# Patient Record
Sex: Male | Born: 1985 | Race: White | Hispanic: No | Marital: Married | Smoking: Never smoker
Health system: Southern US, Community
[De-identification: ages and names within clinical notes are randomized; demographics above are authoritative.]

## PROBLEM LIST (undated history)

## (undated) DIAGNOSIS — K5792 Diverticulitis of intestine, part unspecified, without perforation or abscess without bleeding: Secondary | ICD-10-CM

## (undated) DIAGNOSIS — K219 Gastro-esophageal reflux disease without esophagitis: Secondary | ICD-10-CM

## (undated) DIAGNOSIS — F32A Depression, unspecified: Secondary | ICD-10-CM

## (undated) DIAGNOSIS — F419 Anxiety disorder, unspecified: Secondary | ICD-10-CM

## (undated) DIAGNOSIS — G4730 Sleep apnea, unspecified: Secondary | ICD-10-CM

## (undated) DIAGNOSIS — S069X9A Unspecified intracranial injury with loss of consciousness of unspecified duration, initial encounter: Secondary | ICD-10-CM

## (undated) DIAGNOSIS — S069XAA Unspecified intracranial injury with loss of consciousness status unknown, initial encounter: Secondary | ICD-10-CM

## (undated) DIAGNOSIS — N289 Disorder of kidney and ureter, unspecified: Secondary | ICD-10-CM

## (undated) DIAGNOSIS — N189 Chronic kidney disease, unspecified: Secondary | ICD-10-CM

## (undated) DIAGNOSIS — M6282 Rhabdomyolysis: Secondary | ICD-10-CM

## (undated) HISTORY — PX: COLONOSCOPY: SHX174

---

## 2009-09-14 ENCOUNTER — Encounter: Admission: RE | Admit: 2009-09-14 | Discharge: 2009-09-14 | Payer: Self-pay | Admitting: Occupational Medicine

## 2009-09-18 DIAGNOSIS — N179 Acute kidney failure, unspecified: Secondary | ICD-10-CM

## 2009-09-18 DIAGNOSIS — M6282 Rhabdomyolysis: Secondary | ICD-10-CM

## 2009-09-18 HISTORY — DX: Rhabdomyolysis: M62.82

## 2009-09-18 HISTORY — DX: Acute kidney failure, unspecified: N17.9

## 2010-02-06 ENCOUNTER — Emergency Department: Payer: Self-pay | Admitting: Emergency Medicine

## 2010-03-15 ENCOUNTER — Emergency Department (HOSPITAL_COMMUNITY): Admission: EM | Admit: 2010-03-15 | Discharge: 2010-03-15 | Payer: Self-pay | Admitting: Emergency Medicine

## 2010-03-17 ENCOUNTER — Ambulatory Visit: Payer: Self-pay | Admitting: Cardiovascular Disease

## 2010-03-17 ENCOUNTER — Inpatient Hospital Stay: Payer: Self-pay | Admitting: Internal Medicine

## 2010-04-25 ENCOUNTER — Emergency Department: Payer: Self-pay | Admitting: Emergency Medicine

## 2010-06-09 ENCOUNTER — Ambulatory Visit: Payer: Self-pay | Admitting: Internal Medicine

## 2010-10-04 ENCOUNTER — Emergency Department (HOSPITAL_COMMUNITY)
Admission: EM | Admit: 2010-10-04 | Discharge: 2010-10-04 | Payer: Self-pay | Source: Home / Self Care | Admitting: Emergency Medicine

## 2011-01-02 IMAGING — CR DG LUMBAR SPINE COMPLETE 4+V
5 series · 5 of 5 positions shown · non-contrast
Comparison: None

CLINICAL DATA: Low back pain following injury.

LUMBAR SPINE - COMPLETE 4+ VIEW

[t l-spine a.p.]
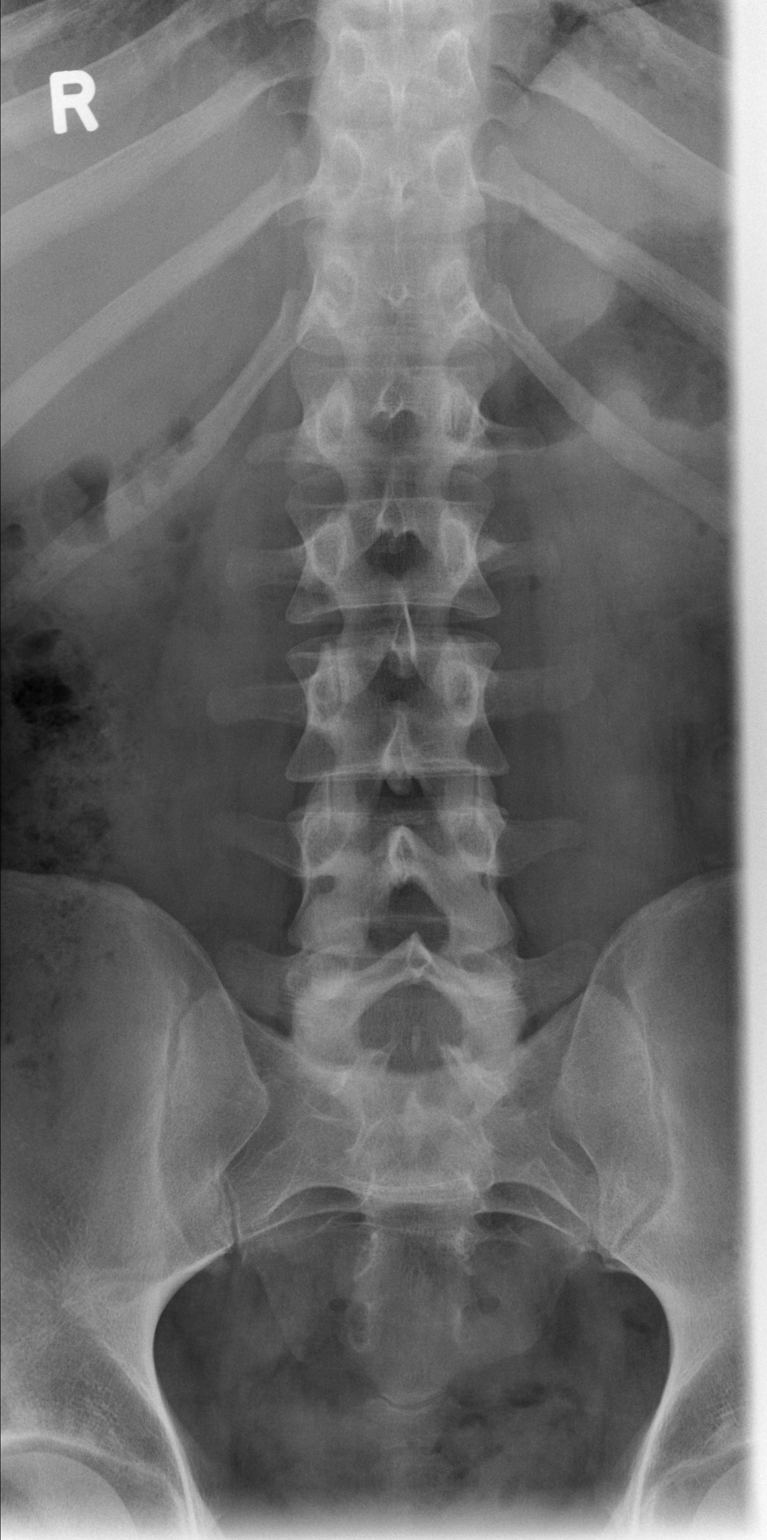

[t l-spine oblique exposure (1 of 2)]
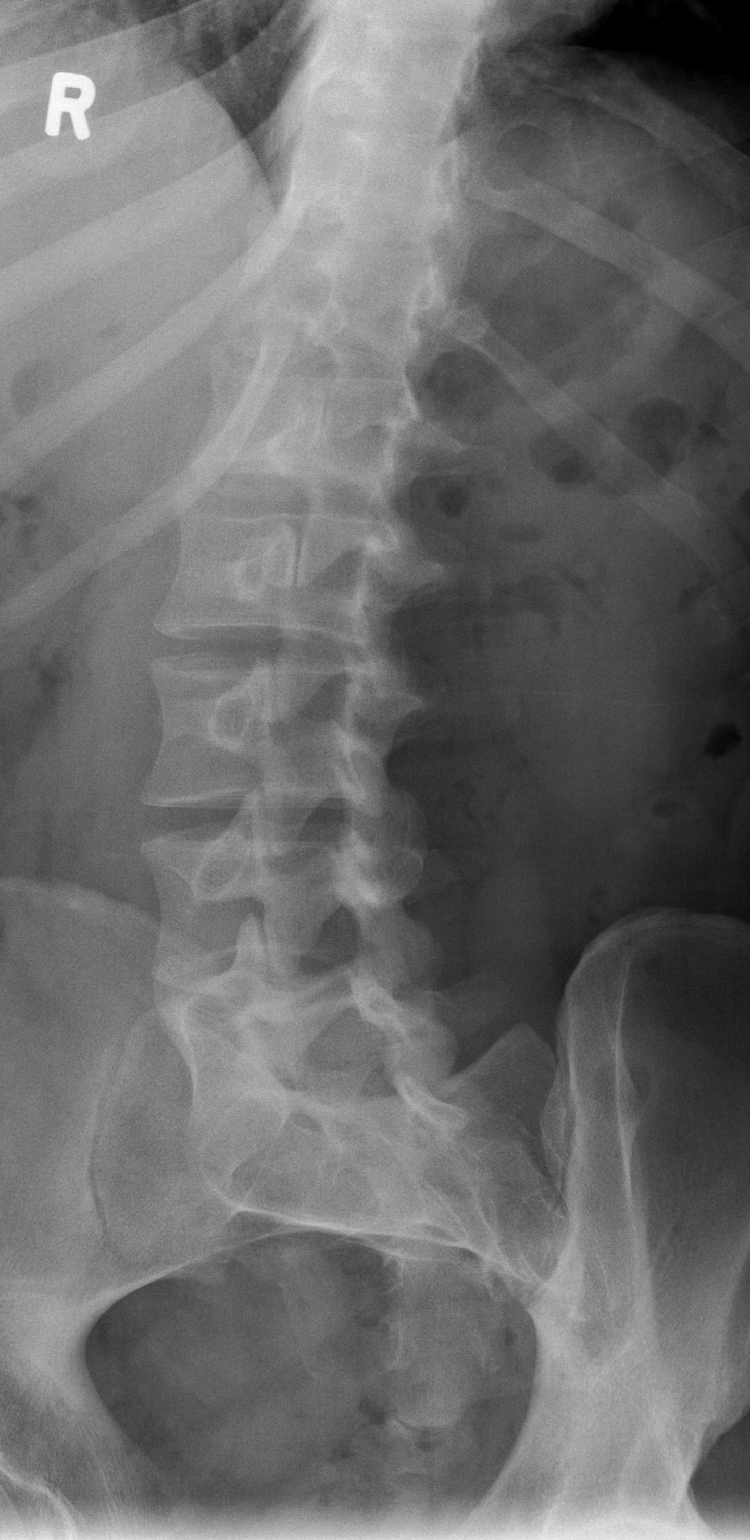

[t l-spine oblique exposure (2 of 2)]
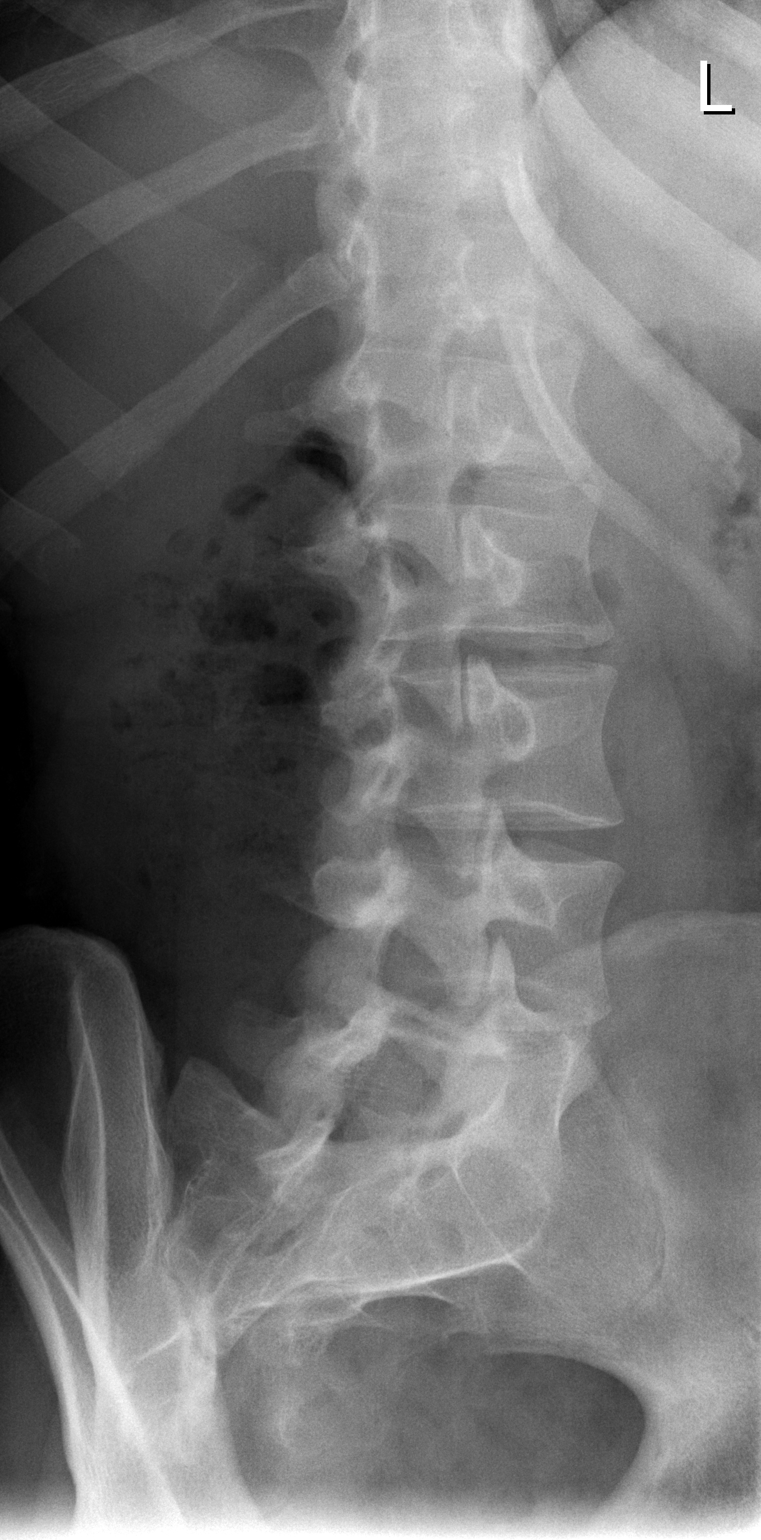

[t l-spine lat]
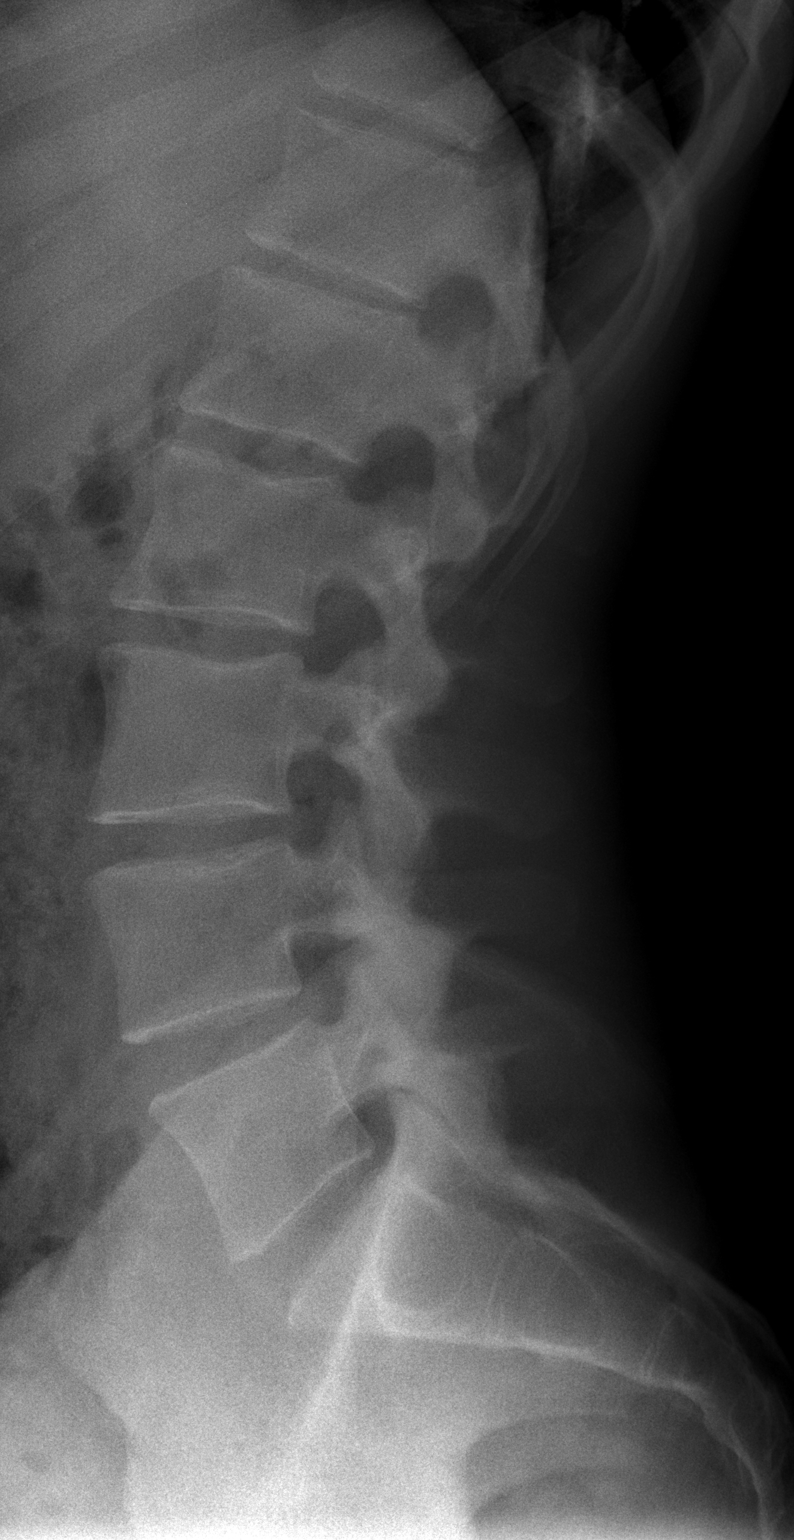

[t l-spine l5-s1 spot]
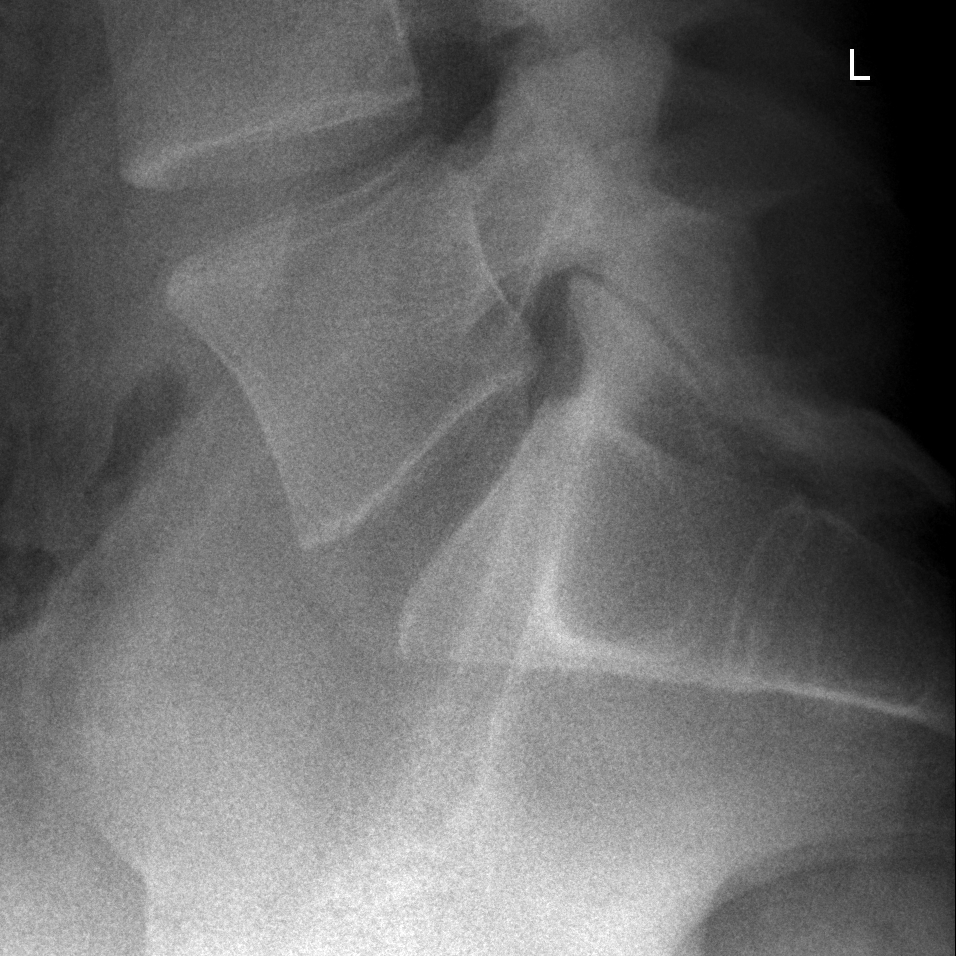

[5 of 5 positions shown; findings below may reference images not displayed]

FINDINGS: Five non-rib bearing lumbar type vertebra are identified
in normal alignment.
There is no evidence of acute fracture or subluxation.
The disc spaces are maintained.
There is no evidence of focal bony lesion or spondylolysis.
IMPRESSION: Unremarkable lumbar spine series.

## 2011-10-11 ENCOUNTER — Emergency Department: Payer: Self-pay | Admitting: Emergency Medicine

## 2012-04-25 ENCOUNTER — Emergency Department (HOSPITAL_COMMUNITY)
Admission: EM | Admit: 2012-04-25 | Discharge: 2012-04-26 | Disposition: A | Discharge status: Home / Self Care | Payer: Worker's Compensation | Attending: Emergency Medicine | Admitting: Emergency Medicine

## 2012-04-25 DIAGNOSIS — W268XXA Contact with other sharp object(s), not elsewhere classified, initial encounter: Secondary | ICD-10-CM | POA: Insufficient documentation

## 2012-04-25 DIAGNOSIS — Y990 Civilian activity done for income or pay: Secondary | ICD-10-CM | POA: Insufficient documentation

## 2012-04-25 DIAGNOSIS — Z7721 Contact with and (suspected) exposure to potentially hazardous body fluids: Secondary | ICD-10-CM | POA: Insufficient documentation

## 2012-04-25 DIAGNOSIS — S51809A Unspecified open wound of unspecified forearm, initial encounter: Secondary | ICD-10-CM | POA: Insufficient documentation

## 2012-04-25 DIAGNOSIS — IMO0002 Reserved for concepts with insufficient information to code with codable children: Secondary | ICD-10-CM | POA: Insufficient documentation

## 2012-04-25 DIAGNOSIS — S61209A Unspecified open wound of unspecified finger without damage to nail, initial encounter: Secondary | ICD-10-CM | POA: Insufficient documentation

## 2012-04-25 DIAGNOSIS — T148XXA Other injury of unspecified body region, initial encounter: Secondary | ICD-10-CM

## 2012-04-25 MED ORDER — TETANUS-DIPHTH-ACELL PERTUSSIS 5-2.5-18.5 LF-MCG/0.5 IM SUSP
0.5000 mL | Freq: Once | INTRAMUSCULAR | Status: AC
  Administered 2012-04-26: 0.5 mL via INTRAMUSCULAR
  Filled 2012-04-25: qty 0.5

## 2012-04-25 NOTE — ED Notes (Signed)
Bed:WTR7<BR> Expected date:<BR> Expected time:<BR> Means of arrival:<BR> Comments:<BR>

## 2012-04-26 ENCOUNTER — Encounter (HOSPITAL_COMMUNITY): Payer: Self-pay | Admitting: Emergency Medicine
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
## 2012-04-30 ENCOUNTER — Ambulatory Visit: Payer: Worker's Compensation

## 2012-04-30 ENCOUNTER — Other Ambulatory Visit: Payer: Self-pay | Admitting: Occupational Medicine

## 2012-04-30 DIAGNOSIS — M79646 Pain in unspecified finger(s): Secondary | ICD-10-CM

## 2014-03-06 ENCOUNTER — Emergency Department (HOSPITAL_COMMUNITY)
Admission: EM | Admit: 2014-03-06 | Discharge: 2014-03-06 | Disposition: A | Discharge status: Home / Self Care | Payer: Worker's Compensation | Attending: Emergency Medicine | Admitting: Emergency Medicine

## 2014-03-06 ENCOUNTER — Encounter (HOSPITAL_COMMUNITY): Payer: Self-pay | Admitting: Emergency Medicine

## 2014-03-06 DIAGNOSIS — Z87820 Personal history of traumatic brain injury: Secondary | ICD-10-CM | POA: Insufficient documentation

## 2014-03-06 DIAGNOSIS — N289 Disorder of kidney and ureter, unspecified: Secondary | ICD-10-CM | POA: Insufficient documentation

## 2014-03-06 DIAGNOSIS — T148XXA Other injury of unspecified body region, initial encounter: Secondary | ICD-10-CM

## 2014-03-06 DIAGNOSIS — Z888 Allergy status to other drugs, medicaments and biological substances status: Secondary | ICD-10-CM | POA: Insufficient documentation

## 2014-03-06 DIAGNOSIS — Z23 Encounter for immunization: Secondary | ICD-10-CM | POA: Insufficient documentation

## 2014-03-06 DIAGNOSIS — IMO0002 Reserved for concepts with insufficient information to code with codable children: Secondary | ICD-10-CM | POA: Insufficient documentation

## 2014-03-06 HISTORY — DX: Unspecified intracranial injury with loss of consciousness status unknown, initial encounter: S06.9XAA

## 2014-03-06 HISTORY — DX: Disorder of kidney and ureter, unspecified: N28.9

## 2014-03-06 HISTORY — DX: Unspecified intracranial injury with loss of consciousness of unspecified duration, initial encounter: S06.9X9A

## 2014-03-06 MED ORDER — TETANUS-DIPHTH-ACELL PERTUSSIS 5-2.5-18.5 LF-MCG/0.5 IM SUSP
0.5000 mL | Freq: Once | INTRAMUSCULAR | Status: AC
  Administered 2014-03-06: 0.5 mL via INTRAMUSCULAR
  Filled 2014-03-06: qty 0.5
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
## 2014-03-06 NOTE — ED Provider Notes (Signed)
Medical screening examination/treatment/procedure(s) were performed by non-physician practitioner and as supervising physician I was immediately available for consultation/collaboration.   EKG Interpretation None       Stephen Kohut, MD 03/06/14 0801 
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
## 2014-10-15 ENCOUNTER — Emergency Department (HOSPITAL_COMMUNITY): Payer: Worker's Compensation

## 2014-10-15 ENCOUNTER — Encounter (HOSPITAL_COMMUNITY): Payer: Self-pay | Admitting: *Deleted

## 2014-10-15 ENCOUNTER — Emergency Department (HOSPITAL_COMMUNITY)
Admission: EM | Admit: 2014-10-15 | Discharge: 2014-10-15 | Disposition: A | Discharge status: Home / Self Care | Payer: Worker's Compensation | Attending: Emergency Medicine | Admitting: Emergency Medicine

## 2014-10-15 DIAGNOSIS — Y92410 Unspecified street and highway as the place of occurrence of the external cause: Secondary | ICD-10-CM | POA: Insufficient documentation

## 2014-10-15 DIAGNOSIS — Z87820 Personal history of traumatic brain injury: Secondary | ICD-10-CM | POA: Diagnosis not present

## 2014-10-15 DIAGNOSIS — Y9389 Activity, other specified: Secondary | ICD-10-CM | POA: Insufficient documentation

## 2014-10-15 DIAGNOSIS — Y998 Other external cause status: Secondary | ICD-10-CM | POA: Insufficient documentation

## 2014-10-15 DIAGNOSIS — T07 Unspecified multiple injuries: Secondary | ICD-10-CM | POA: Diagnosis not present

## 2014-10-15 DIAGNOSIS — Z87448 Personal history of other diseases of urinary system: Secondary | ICD-10-CM | POA: Diagnosis not present

## 2014-10-15 DIAGNOSIS — S40029A Contusion of unspecified upper arm, initial encounter: Secondary | ICD-10-CM

## 2014-10-15 LAB — I-STAT CHEM 8, ED
BUN: 22 mg/dL (ref 6–23)
CALCIUM ION: 1.23 mmol/L (ref 1.12–1.23)
Chloride: 105 mmol/L (ref 96–112)
Creatinine, Ser: 1 mg/dL (ref 0.50–1.35)
Glucose, Bld: 100 mg/dL — ABNORMAL HIGH (ref 70–99)
HCT: 48 % (ref 39.0–52.0)
Hemoglobin: 16.3 g/dL (ref 13.0–17.0)
Potassium: 4.2 mmol/L (ref 3.5–5.1)
Sodium: 140 mmol/L (ref 135–145)
TCO2: 23 mmol/L (ref 0–100)

## 2014-10-15 MED ORDER — HYDROCODONE-ACETAMINOPHEN 5-325 MG PO TABS
1.0000 | ORAL_TABLET | Freq: Once | ORAL | Status: AC
  Administered 2014-10-15: 1 via ORAL
  Filled 2014-10-15: qty 1

## 2014-10-15 MED ORDER — HYDROCODONE-ACETAMINOPHEN 5-325 MG PO TABS: 1.0000 | ORAL_TABLET | ORAL | Status: DC | PRN

## 2014-10-15 MED ORDER — IBUPROFEN 800 MG PO TABS: 800.0000 mg | ORAL_TABLET | Freq: Three times a day (TID) | ORAL | Status: DC
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
## 2014-10-15 NOTE — Discharge Instructions (Signed)
Cryotherapy °Cryotherapy means treatment with cold. Ice or gel packs can be used to reduce both pain and swelling. Ice is the most helpful within the first 24 to 48 hours after an injury or flare-up from overusing a muscle or joint. Sprains, strains, spasms, burning pain, shooting pain, and aches can all be eased with ice. Ice can also be used when recovering from surgery. Ice is effective, has very few side effects, and is safe for most people to use. °PRECAUTIONS  °Ice is not a safe treatment option for people with: °· Raynaud phenomenon. This is a condition affecting small blood vessels in the extremities. Exposure to cold may cause your problems to return. °· Cold hypersensitivity. There are many forms of cold hypersensitivity, including: °· Cold urticaria. Red, itchy hives appear on the skin when the tissues begin to warm after being iced. °· Cold erythema. This is a red, itchy rash caused by exposure to cold. °· Cold hemoglobinuria. Red blood cells break down when the tissues begin to warm after being iced. The hemoglobin that carry oxygen are passed into the urine because they cannot combine with blood proteins fast enough. °· Numbness or altered sensitivity in the area being iced. °If you have any of the following conditions, do not use ice until you have discussed cryotherapy with your caregiver: °· Heart conditions, such as arrhythmia, angina, or chronic heart disease. °· High blood pressure. °· Healing wounds or open skin in the area being iced. °· Current infections. °· Rheumatoid arthritis. °· Poor circulation. °· Diabetes. °Ice slows the blood flow in the region it is applied. This is beneficial when trying to stop inflamed tissues from spreading irritating chemicals to surrounding tissues. However, if you expose your skin to cold temperatures for too long or without the proper protection, you can damage your skin or nerves. Watch for signs of skin damage due to cold. °HOME CARE INSTRUCTIONS °Follow  these tips to use ice and cold packs safely. °· Place a dry or damp towel between the ice and skin. A damp towel will cool the skin more quickly, so you may need to shorten the time that the ice is used. °· For a more rapid response, add gentle compression to the ice. °· Ice for no more than 10 to 20 minutes at a time. The bonier the area you are icing, the less time it will take to get the benefits of ice. °· Check your skin after 5 minutes to make sure there are no signs of a poor response to cold or skin damage. °· Rest 20 minutes or more between uses. °· Once your skin is numb, you can end your treatment. You can test numbness by very lightly touching your skin. The touch should be so light that you do not see the skin dimple from the pressure of your fingertip. When using ice, most people will feel these normal sensations in this order: cold, burning, aching, and numbness. °· Do not use ice on someone who cannot communicate their responses to pain, such as small children or people with dementia. °HOW TO MAKE AN ICE PACK °Ice packs are the most common way to use ice therapy. Other methods include ice massage, ice baths, and cryosprays. Muscle creams that cause a cold, tingly feeling do not offer the same benefits that ice offers and should not be used as a substitute unless recommended by your caregiver. °To make an ice pack, do one of the following: °· Place crushed ice or a   bag of frozen vegetables in a sealable plastic bag. Squeeze out the excess air. Place this bag inside another plastic bag. Slide the bag into a pillowcase or place a damp towel between your skin and the bag. °· Mix 3 parts water with 1 part rubbing alcohol. Freeze the mixture in a sealable plastic bag. When you remove the mixture from the freezer, it will be slushy. Squeeze out the excess air. Place this bag inside another plastic bag. Slide the bag into a pillowcase or place a damp towel between your skin and the bag. °SEEK MEDICAL CARE  IF: °· You develop white spots on your skin. This may give the skin a blotchy (mottled) appearance. °· Your skin turns blue or pale. °· Your skin becomes waxy or hard. °· Your swelling gets worse. °MAKE SURE YOU:  °· Understand these instructions. °· Will watch your condition. °· Will get help right away if you are not doing well or get worse. °Document Released: 05/01/2011 Document Revised: 01/19/2014 Document Reviewed: 05/01/2011 °ExitCare® Patient Information ©2015 [employer]. This information is not intended to replace advice given to you by your health care provider. Make sure you discuss any questions you have with your health care provider. ° °Contusion °A contusion is a deep bruise. Contusions are the result of an injury that caused bleeding under the skin. The contusion may turn blue, purple, or yellow. Minor injuries will give you a painless contusion, but more severe contusions may stay painful and swollen for a few weeks.  °CAUSES  °A contusion is usually caused by a blow, trauma, or direct force to an area of the body. °SYMPTOMS  °· Swelling and redness of the injured area. °· Bruising of the injured area. °· Tenderness and soreness of the injured area. °· Pain. °DIAGNOSIS  °The diagnosis can be made by taking a history and physical exam. An X-ray, CT scan, or MRI may be needed to determine if there were any associated injuries, such as fractures. °TREATMENT  °Specific treatment will depend on what area of the body was injured. In general, the best treatment for a contusion is resting, icing, elevating, and applying cold compresses to the injured area. Over-the-counter medicines may also be recommended for pain control. Ask your caregiver what the best treatment is for your contusion. °HOME CARE INSTRUCTIONS  °· Put ice on the injured area. °¨ Put ice in a plastic bag. °¨ Place a towel between your skin and the bag. °¨ Leave the ice on for 15-20 minutes, 3-4 times a day, or as directed by your health  care provider. °· Only take over-the-counter or prescription medicines for pain, discomfort, or fever as directed by your caregiver. Your caregiver may recommend avoiding anti-inflammatory medicines (aspirin, ibuprofen, and naproxen) for 48 hours because these medicines may increase bruising. °· Rest the injured area. °· If possible, elevate the injured area to reduce swelling. °SEEK IMMEDIATE MEDICAL CARE IF:  °· You have increased bruising or swelling. °· You have pain that is getting worse. °· Your swelling or pain is not relieved with medicines. °MAKE SURE YOU:  °· Understand these instructions. °· Will watch your condition. °· Will get help right away if you are not doing well or get worse. °Document Released: 06/14/2005 Document Revised: 09/09/2013 Document Reviewed: 07/10/2011 °ExitCare® Patient Information ©2015 [employer]. This information is not intended to replace advice given to you by your health care provider. Make sure you discuss any questions you have with your health care provider. ° °

## 2014-10-15 NOTE — ED Notes (Signed)
[employer] at bedside 

## 2014-10-23 DIAGNOSIS — F410 Panic disorder [episodic paroxysmal anxiety] without agoraphobia: Secondary | ICD-10-CM | POA: Insufficient documentation

## 2016-07-12 ENCOUNTER — Other Ambulatory Visit
Admission: EM | Admit: 2016-07-12 | Discharge: 2016-07-12 | Disposition: A | Discharge status: Home / Self Care | Attending: Family Medicine | Admitting: Family Medicine
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
## 2016-07-26 DIAGNOSIS — G5712 Meralgia paresthetica, left lower limb: Secondary | ICD-10-CM | POA: Insufficient documentation

## 2016-10-16 ENCOUNTER — Encounter: Payer: Self-pay | Admitting: Physician Assistant

## 2016-10-16 ENCOUNTER — Ambulatory Visit: Payer: Self-pay | Admitting: Physician Assistant

## 2016-10-16 VITALS — BP 120/80 | HR 77 | Temp 98.7°F

## 2016-10-16 DIAGNOSIS — J209 Acute bronchitis, unspecified: Secondary | ICD-10-CM

## 2016-10-16 MED ORDER — AZITHROMYCIN 250 MG PO TABS: ORAL_TABLET | ORAL | 0 refills | Status: DC

## 2016-10-16 MED ORDER — METHYLPREDNISOLONE 4 MG PO TBPK: ORAL_TABLET | ORAL | 0 refills | Status: DC

## 2016-10-16 NOTE — Progress Notes (Signed)
S: C/o runny nose and congestion with dry cough for 7 days, + fever, chills early on, none now, is getting winded when he goes up stairs,  denies cp/sob, v/d; mucus was green/yellow  Using otc meds: mucinex, cough drops  O: PE: vitals wnl, nad,  perrl eomi, normocephalic, tms dull, nasal mucosa red and swollen, throat injected, neck supple no lymph, lungs c t a, cv rrr, neuro intact, cough is congested  A:  Acute flu like illness, secondary bronchitis   P: drink fluids, continue regular meds , use otc meds of choice, return if not improving in 3 days, return earlier if worsening , zpack, medrol dose pack, return if not improving or if worsening

## 2016-12-19 DIAGNOSIS — Z7185 Encounter for immunization safety counseling: Secondary | ICD-10-CM | POA: Insufficient documentation

## 2016-12-19 DIAGNOSIS — Z7189 Other specified counseling: Secondary | ICD-10-CM | POA: Insufficient documentation

## 2017-07-12 ENCOUNTER — Encounter: Payer: Self-pay | Admitting: Urology

## 2017-07-12 ENCOUNTER — Ambulatory Visit (INDEPENDENT_AMBULATORY_CARE_PROVIDER_SITE_OTHER): Payer: Managed Care, Other (non HMO) | Admitting: Urology

## 2017-07-12 VITALS — BP 117/69 | HR 73 | Ht 69.5 in | Wt 232.0 lb

## 2017-07-12 DIAGNOSIS — Z8719 Personal history of other diseases of the digestive system: Secondary | ICD-10-CM | POA: Insufficient documentation

## 2017-07-12 DIAGNOSIS — R361 Hematospermia: Secondary | ICD-10-CM

## 2017-07-12 DIAGNOSIS — F429 Obsessive-compulsive disorder, unspecified: Secondary | ICD-10-CM | POA: Insufficient documentation

## 2017-07-12 DIAGNOSIS — F4310 Post-traumatic stress disorder, unspecified: Secondary | ICD-10-CM | POA: Insufficient documentation

## 2017-07-12 LAB — URINALYSIS, COMPLETE
BILIRUBIN UA: NEGATIVE
Glucose, UA: NEGATIVE
KETONES UA: NEGATIVE
Leukocytes, UA: NEGATIVE
Nitrite, UA: NEGATIVE
SPEC GRAV UA: 1.01 (ref 1.005–1.030)
Urobilinogen, Ur: 0.2 mg/dL (ref 0.2–1.0)
pH, UA: 5.5 (ref 5.0–7.5)

## 2017-07-12 NOTE — Progress Notes (Signed)
07/12/2017 12:11 PM   Tollie Pizzaimothy J Mangrum [birth year] [id number]  Referring provider: Marisue IvanLinthavong, Kanhka, MD [phone] [facility] [facility] [place], [place] [id number]  Chief Complaint  Patient presents with  . Hematospermia    New Patient    HPI: William Curtis is a 31 year old male who presents today for an evaluation of hematospermia.  He states he has noted intermittent blood in his semen since age 31 however 3 weeks ago states the blood has been consistent.  He has intercourse on average 3 times per week and states that his semen is mostly blood.  He has a dull scrotal discomfort after ejaculation and mild aching with ejaculation.  He will passing occasional small clot on the first void after ejaculation.  Denies gross hematuria per se.  He has no bothersome lower urinary tract symptoms and voids with a good stream.  He has mild dysuria on his first void after ejaculation.  He has a history of acute renal failure requiring dialysis secondary to rhabdomyolysis.   PMH: Past Medical History:  Diagnosis Date  . Renal disorder   . TBI (traumatic brain injury) [facility](HCC)     Surgical History: No past surgical history on file.  Home Medications:  Allergies as of 07/12/2017      Reactions   Zolmitriptan Other (See Comments)   Chest tightness and numbness in side Chest tightness   Ciprofloxacin Rash   Oxycodone-acetaminophen Rash      Medication List       Accurate as of 07/12/17 12:11 PM. Always use your most recent med list.          busPIRone 10 MG tablet Commonly known as:  BUSPAR Take by mouth.   PARoxetine 20 MG tablet Commonly known as:  PAXIL Take by mouth.       Allergies:  Allergies  Allergen Reactions  . Zolmitriptan Other (See Comments)    Chest tightness and numbness in side Chest tightness  . Ciprofloxacin Rash  . Oxycodone-Acetaminophen Rash    Family History: No family history on file.  Social History:  reports that he has never  smoked. He has never used smokeless tobacco. He reports that he drinks alcohol. His drug history is not on file.  ROS: UROLOGY Frequent Urination?: No Hard to postpone urination?: No Burning/pain with urination?: Yes Get up at night to urinate?: No Leakage of urine?: No Urine stream starts and stops?: No Trouble starting stream?: No Do you have to strain to urinate?: No Blood in urine?: Yes Urinary tract infection?: No Sexually transmitted disease?: No Injury to kidneys or bladder?: Yes Painful intercourse?: No Weak stream?: No Erection problems?: No Penile pain?: No  Gastrointestinal Nausea?: No Vomiting?: No Indigestion/heartburn?: Yes Diarrhea?: No Constipation?: No  Constitutional Fever: No Night sweats?: Yes Weight loss?: No Fatigue?: Yes  Skin Skin rash/lesions?: No Itching?: No  Eyes Blurred vision?: No Double vision?: No  Ears/Nose/Throat Sore throat?: No Sinus problems?: No  Hematologic/Lymphatic Swollen glands?: No Easy bruising?: No  Cardiovascular Leg swelling?: No Chest pain?: No  Respiratory Cough?: No Shortness of breath?: No  Endocrine Excessive thirst?: No  Musculoskeletal Back pain?: No Joint pain?: No  Neurological Headaches?: No Dizziness?: No  Psychologic Depression?: Yes Anxiety?: Yes  Physical Exam: BP 117/69   Pulse 73   Ht 5' 9.5" (1.765 m)   Wt 232 lb (105.2 kg)   BMI 33.77 kg/m   Constitutional:  Alert and oriented, No acute distress. HEENT: [place] AT, moist mucus membranes.  Trachea  midline, no masses. Cardiovascular: No clubbing, cyanosis, or edema. Respiratory: Normal respiratory effort, no increased work of breathing. GI: Abdomen is soft, nontender, nondistended, no abdominal masses GU: No CVA tenderness.  Penis circumcised without lesions, testes descended bilaterally without masses or tenderness, cord/epididymes palpably normal.  Prostate 30 g, smooth without nodules or tenderness. Skin: No rashes,  bruises or suspicious lesions. Lymph: No cervical or inguinal adenopathy. Neurologic: Grossly intact, no focal deficits, moving all 4 extremities. Psychiatric: Normal mood and affect.  Laboratory Data: Lab Results  Component Value Date   HGB 16.3 10/15/2014   HCT 48.0 10/15/2014    Lab Results  Component Value Date   CREATININE 1.00 10/15/2014    Urinalysis Dipstick and microscopy negative   Assessment & Plan:   1. Hematospermia  Based on his age and long-standing intermittent hematospermia have recommended scheduling a pelvic MRI to evaluate for congenital seminal vesicle anomalies.  Cystoscopy if no significant abnormality identified on MRI.  Urinalysis today was clear  - Urinalysis, Complete - MR Pelvis W Wo Contrast; Future    Riki Altes, MD  [facility] [address] [place], [place] [address] [phone]

## 2017-07-24 ENCOUNTER — Ambulatory Visit: Payer: Managed Care, Other (non HMO) | Admitting: Urology

## 2017-07-25 ENCOUNTER — Telehealth: Payer: Self-pay | Admitting: Urology

## 2017-07-25 DIAGNOSIS — R361 Hematospermia: Secondary | ICD-10-CM

## 2017-07-25 NOTE — Telephone Encounter (Signed)
Spoke with the patient today and he wanted you to know that every time now it is nothing but blood. No semen just blood coming out. I have not canceled his appointment for next week yet, should he keep it?  Please advise  Thanks, Marcelino DusterMichelle

## 2017-07-26 NOTE — Telephone Encounter (Signed)
Since mri was not approved sched CT pelvis.  Order was entered.

## 2017-07-26 NOTE — Telephone Encounter (Signed)
So once again I had to fax notes to his insurance and they will review them to see if this can be approved. Will keep you posted.  William DusterMichelle

## 2017-08-02 ENCOUNTER — Ambulatory Visit: Payer: Managed Care, Other (non HMO) | Admitting: Urology

## 2017-08-22 ENCOUNTER — Telehealth: Payer: Self-pay | Admitting: Urology

## 2017-08-22 NOTE — Telephone Encounter (Signed)
They denied his CT scan  [facility]

## 2017-08-23 ENCOUNTER — Telehealth: Payer: Self-pay | Admitting: Urology

## 2017-08-23 NOTE — Telephone Encounter (Signed)
Recommend scheduling transrectal ultrasound of the prostate in office

## 2017-08-23 NOTE — Telephone Encounter (Signed)
Done ° ° °Michelle °

## 2017-09-05 ENCOUNTER — Encounter: Payer: Self-pay | Admitting: Urology

## 2017-09-05 ENCOUNTER — Ambulatory Visit: Payer: Managed Care, Other (non HMO) | Admitting: Urology

## 2017-09-05 VITALS — BP 116/84 | HR 68 | Ht 69.5 in | Wt 237.8 lb

## 2017-09-05 DIAGNOSIS — R361 Hematospermia: Secondary | ICD-10-CM | POA: Diagnosis not present

## 2017-09-05 NOTE — Progress Notes (Signed)
Procedure note  Indications: 31 year old male with hematospermia.  Pelvic imaging was recommended however denied by his insurance company.  They would cover transrectal ultrasound of the prostate.  He states his hematospermia has resolved.  Description: A transrectal ultrasound probe was lubricated and placed per rectum.  Sagittal and transverse views were obtained.  The seminal vesicles were normal in appearance without dilation.  No cystic structures were identified.  The prostate volume was measured at 24.5 cc.  There were scattered prostatic calcifications present.  Impression: Unremarkable prostate ultrasound  Recommendation: Hematospermia has resolved.  He will return in 2-4 weeks for a baseline PSA.  Cystoscopy for recurrent hematospermia.

## 2017-09-19 ENCOUNTER — Other Ambulatory Visit: Payer: Managed Care, Other (non HMO)

## 2017-09-19 DIAGNOSIS — R361 Hematospermia: Secondary | ICD-10-CM

## 2017-09-20 ENCOUNTER — Telehealth: Payer: Self-pay

## 2017-09-20 LAB — PSA: PROSTATE SPECIFIC AG, SERUM: 0.9 ng/mL (ref 0.0–4.0)

## 2017-09-20 NOTE — Telephone Encounter (Signed)
-----   Message from Riki AltesScott C Stoioff, MD sent at 09/20/2017  9:32 AM EST ----- PSA normal at 0.9

## 2017-09-20 NOTE — Telephone Encounter (Signed)
Pt.notified

## 2018-05-28 DIAGNOSIS — Z0000 Encounter for general adult medical examination without abnormal findings: Secondary | ICD-10-CM | POA: Diagnosis not present

## 2018-06-05 DIAGNOSIS — Z0000 Encounter for general adult medical examination without abnormal findings: Secondary | ICD-10-CM | POA: Diagnosis not present

## 2019-01-30 DIAGNOSIS — R0982 Postnasal drip: Secondary | ICD-10-CM | POA: Diagnosis not present

## 2019-03-19 ENCOUNTER — Telehealth: Payer: Self-pay | Admitting: Urology

## 2019-03-19 ENCOUNTER — Emergency Department
Admission: EM | Admit: 2019-03-19 | Discharge: 2019-03-19 | Disposition: A | Discharge status: Home / Self Care | Payer: 59 | Attending: Emergency Medicine | Admitting: Emergency Medicine

## 2019-03-19 ENCOUNTER — Encounter: Payer: Self-pay | Admitting: Emergency Medicine

## 2019-03-19 ENCOUNTER — Other Ambulatory Visit: Payer: Self-pay

## 2019-03-19 DIAGNOSIS — Z87820 Personal history of traumatic brain injury: Secondary | ICD-10-CM | POA: Diagnosis not present

## 2019-03-19 DIAGNOSIS — R339 Retention of urine, unspecified: Secondary | ICD-10-CM

## 2019-03-19 DIAGNOSIS — R361 Hematospermia: Secondary | ICD-10-CM | POA: Diagnosis not present

## 2019-03-19 DIAGNOSIS — Z79899 Other long term (current) drug therapy: Secondary | ICD-10-CM | POA: Insufficient documentation

## 2019-03-19 DIAGNOSIS — N50811 Right testicular pain: Secondary | ICD-10-CM | POA: Diagnosis not present

## 2019-03-19 DIAGNOSIS — Z885 Allergy status to narcotic agent status: Secondary | ICD-10-CM | POA: Diagnosis not present

## 2019-03-19 DIAGNOSIS — Z888 Allergy status to other drugs, medicaments and biological substances status: Secondary | ICD-10-CM | POA: Insufficient documentation

## 2019-03-19 DIAGNOSIS — Z881 Allergy status to other antibiotic agents status: Secondary | ICD-10-CM | POA: Diagnosis not present

## 2019-03-19 LAB — BASIC METABOLIC PANEL
Anion gap: 10 (ref 5–15)
BUN: 24 mg/dL — ABNORMAL HIGH (ref 6–20)
CO2: 27 mmol/L (ref 22–32)
Calcium: 9.6 mg/dL (ref 8.9–10.3)
Chloride: 103 mmol/L (ref 98–111)
Creatinine, Ser: 1.21 mg/dL (ref 0.61–1.24)
GFR calc Af Amer: >60 mL/min (ref >60)
GFR calc non Af Amer: >60 mL/min (ref >60)
Glucose, Bld: 101 mg/dL — ABNORMAL HIGH (ref 70–99)
Potassium: 4.3 mmol/L (ref 3.5–5.1)
Sodium: 140 mmol/L (ref 135–145)

## 2019-03-19 LAB — CBC WITH DIFFERENTIAL/PLATELET
Abs Immature Granulocytes: 0.02 10*3/uL (ref 0.00–0.07)
Basophils Absolute: 0.1 10*3/uL (ref 0.0–0.1)
Basophils Relative: 1 %
Eosinophils Absolute: 0.1 10*3/uL (ref 0.0–0.5)
Eosinophils Relative: 1 %
HCT: 45.2 % (ref 39.0–52.0)
Hemoglobin: 15.5 g/dL (ref 13.0–17.0)
Immature Granulocytes: 0 %
Lymphocytes Relative: 21 %
Lymphs Abs: 1.2 10*3/uL (ref 0.7–4.0)
MCH: 31.7 pg (ref 26.0–34.0)
MCHC: 34.3 g/dL (ref 30.0–36.0)
MCV: 92.4 fL (ref 80.0–100.0)
Monocytes Absolute: 0.7 10*3/uL (ref 0.1–1.0)
Monocytes Relative: 13 %
Neutro Abs: 3.6 10*3/uL (ref 1.7–7.7)
Neutrophils Relative %: 64 %
Platelets: 224 10*3/uL (ref 150–400)
RBC: 4.89 MIL/uL (ref 4.22–5.81)
RDW: 12.6 % (ref 11.5–15.5)
WBC: 5.7 10*3/uL (ref 4.0–10.5)
nRBC: 0 % (ref 0.0–0.2)

## 2019-03-19 LAB — URINALYSIS, COMPLETE (UACMP) WITH MICROSCOPIC
Bacteria, UA: NONE SEEN
RBC / HPF: >50 RBC/hpf — ABNORMAL HIGH (ref 0–5)
Specific Gravity, Urine: 1.033 — ABNORMAL HIGH (ref 1.005–1.030)
Squamous Epithelial / HPF: NONE SEEN (ref 0–5)

## 2019-03-19 LAB — URINE DRUG SCREEN, QUALITATIVE (ARMC ONLY)
Amphetamines, Ur Screen: NOT DETECTED
Barbiturates, Ur Screen: NOT DETECTED
Benzodiazepine, Ur Scrn: NOT DETECTED
Cannabinoid 50 Ng, Ur ~~LOC~~: NOT DETECTED
Cocaine Metabolite,Ur ~~LOC~~: NOT DETECTED
MDMA (Ecstasy)Ur Screen: NOT DETECTED
Methadone Scn, Ur: NOT DETECTED
Opiate, Ur Screen: NOT DETECTED
Phencyclidine (PCP) Ur S: NOT DETECTED
Tricyclic, Ur Screen: NOT DETECTED

## 2019-03-19 MED ORDER — CEPHALEXIN 500 MG PO CAPS
500.0000 mg | ORAL_CAPSULE | Freq: Once | ORAL | Status: AC
  Administered 2019-03-19: 500 mg via ORAL
  Filled 2019-03-19: qty 1

## 2019-03-19 MED ORDER — CEPHALEXIN 500 MG PO CAPS: 500.0000 mg | ORAL_CAPSULE | Freq: Two times a day (BID) | ORAL | 0 refills | Status: AC

## 2019-03-19 MED ORDER — TAMSULOSIN HCL 0.4 MG PO CAPS: 0.4000 mg | ORAL_CAPSULE | Freq: Every day | ORAL | 0 refills | Status: AC

## 2019-03-19 NOTE — ED Provider Notes (Signed)
[facility] Provider Note ____________________________________________   First MD Initiated Contact with Patient 03/19/19 [phone]     (approximate)  I have reviewed the triage vital signs and the nursing notes.   HISTORY  Chief Complaint Urinary Retention    HPI William Curtis is a 33 y.o. male with PMH as noted below who presents with urinary retention, acute onset yesterday afternoon and persistent since then, associated with suprapubic and right testicular pain.  The patient states that he has had hematospermia intermittently over the last few years.  He had 2 episodes this week where he had intercourse, ejaculated, and then had blood.  He states that after the second time, which was yesterday, he then was unable to urinate.  Although he has had hematospermia in the past, this has not caused retention before.  He was worked up by urology a couple of years ago for the hematospermia.  He denies fever chills, vomiting or diarrhea.  Past Medical History:  Diagnosis Date  . Renal disorder   . TBI (traumatic brain injury) [facility](HCC)     Patient Active Problem List   Diagnosis Date Noted  . PTSD (post-traumatic stress disorder) 07/12/2017  . OCD (obsessive compulsive disorder) 07/12/2017  . History of ulcerative colitis 07/12/2017  . Vaccine counseling 12/19/2016  . Meralgia paresthetica, left 07/26/2016  . Anxiety attack 10/23/2014    History reviewed. No pertinent surgical history.  Prior to Admission medications   Medication Sig Start Date End Date Taking? Authorizing Provider  busPIRone (BUSPAR) 30 MG tablet Take 30 mg by mouth 2 (two) times daily.    Yes [provider]  PARoxetine (PAXIL) 30 MG tablet Take 30 mg by mouth daily.  02/14/17  Yes [provider]  cephALEXin (KEFLEX) 500 MG capsule Take 1 capsule (500 mg total) by mouth 2 (two) times daily for 7 days. 03/19/19 03/26/19  Dionne BucySiadecki, Buford Bremer, MD    Allergies  Zolmitriptan, Ciprofloxacin, and Oxycodone-acetaminophen  No family history on file.  Social History Social History   Tobacco Use  . Smoking status: Never Smoker  . Smokeless tobacco: Never Used  Substance Use Topics  . Alcohol use: Not Currently  . Drug use: Not Currently    Review of Systems  Constitutional: No fever. Eyes: No redness. ENT: No sore throat. Cardiovascular: Denies chest pain. Respiratory: Denies shortness of breath. Gastrointestinal: No vomiting. Genitourinary: Positive for hematospermia.  Musculoskeletal: Negative for back pain. Skin: Negative for rash. Neurological: Negative for headache.   ____________________________________________   PHYSICAL EXAM:  VITAL SIGNS: ED Triage Vitals  Enc Vitals Group     BP 03/19/19 0901 (!) 138/95     Pulse Rate 03/19/19 0901 85     Resp 03/19/19 0901 18     Temp 03/19/19 0901 98.5 F (36.9 C)     Temp Source 03/19/19 0901 Oral     SpO2 03/19/19 0901 97 %     Weight 03/19/19 0859 223 lb (101.2 kg)     Height 03/19/19 0859 5\' 10"  (1.778 m)     Head Circumference --      Peak Flow --      Pain Score 03/19/19 0859 9     Pain Loc --      Pain Edu? --      Excl. in GC? --     Constitutional: Alert and oriented.  Slightly uncomfortable but overall well appearing and in no acute distress. Eyes: Conjunctivae are normal.  Head: Atraumatic. Nose:  No congestion/rhinnorhea. Mouth/Throat: Mucous membranes are moist.   Neck: Normal range of motion.  Cardiovascular: Good peripheral circulation. Respiratory: Normal respiratory effort.   Gastrointestinal: Soft with mild suprapubic tenderness. Genitourinary: Normal external genitalia.  Minimal pain on palpation of right testicle with no significant tenderness, no swelling, and no rash.  No urethral discharge or blood. Musculoskeletal:  Extremities warm and well perfused.  Neurologic:  Normal speech and language. No gross focal neurologic deficits are appreciated.   Skin:  Skin is warm and dry. No rash noted. Psychiatric: Mood and affect are normal. Speech and behavior are normal.  ____________________________________________   LABS (all labs ordered are listed, but only abnormal results are displayed)  Labs Reviewed  BASIC METABOLIC PANEL - Abnormal; Notable for the following components:      Result Value   Glucose, Bld 101 (*)    BUN 24 (*)    All other components within normal limits  URINALYSIS, COMPLETE (UACMP) WITH MICROSCOPIC - Abnormal; Notable for the following components:   Color, Urine AMBER (*)    APPearance CLOUDY (*)    Specific Gravity, Urine 1.033 (*)    Glucose, UA   (*)    Value: TEST NOT REPORTED DUE TO COLOR INTERFERENCE OF URINE PIGMENT   Hgb urine dipstick   (*)    Value: TEST NOT REPORTED DUE TO COLOR INTERFERENCE OF URINE PIGMENT   Bilirubin Urine   (*)    Value: TEST NOT REPORTED DUE TO COLOR INTERFERENCE OF URINE PIGMENT   Ketones, ur   (*)    Value: TEST NOT REPORTED DUE TO COLOR INTERFERENCE OF URINE PIGMENT   Protein, ur   (*)    Value: TEST NOT REPORTED DUE TO COLOR INTERFERENCE OF URINE PIGMENT   Nitrite   (*)    Value: TEST NOT REPORTED DUE TO COLOR INTERFERENCE OF URINE PIGMENT   Leukocytes,Ua   (*)    Value: TEST NOT REPORTED DUE TO COLOR INTERFERENCE OF URINE PIGMENT   RBC / HPF >50 (*)    All other components within normal limits  CBC WITH DIFFERENTIAL/PLATELET  URINE DRUG SCREEN, QUALITATIVE ([facility] ONLY)   ____________________________________________  EKG   ____________________________________________  RADIOLOGY    ____________________________________________   PROCEDURES  Procedure(s) performed: No  Procedures  Critical Care performed: No ____________________________________________   INITIAL IMPRESSION / ASSESSMENT AND PLAN / ED COURSE  Pertinent labs & imaging results that were available during my care of the patient were reviewed by me and considered in my medical decision  making (see chart for details).  33 year old male with PMH as noted above including a prior history of hematospermia presents with urinary retention since yesterday afternoon after having intercourse and passing blood following the ejaculation.  He has not had retention before.  I reviewed the past medical records in [place] and I see that he was evaluated by Dr. Bernardo Heater from urology 2 years ago with a negative prostate ultrasound.  On exam today the patient is overall well-appearing and his vital signs are normal.  He has mild suprapubic tenderness.  He has no scrotal swelling or significant testicular tenderness.  Bladder scan reveals around 350 cc of urine in the bladder.  Overall I suspect most likely urinary retention due to clotted blood, versus possible infection.  It appears that the testicular pain is radiating from the bladder, as the testicle itself is not significantly tender or swollen and there is no clinical evidence of torsion.  We will obtain basic labs and a  urinalysis and placed a Foley catheter, then I will consult urology.  ----------------------------------------- 11:27 AM on 03/19/2019 -----------------------------------------  Lab work-up is unremarkable.  UA shows significant RBCs and WBCs.  Although I have a lower suspicion for infection, I cannot rule it out by the UA so I will give the patient empiric antibiotics.  I consulted Dr. Apolinar JunesBrandon from urology who recommended adding on a urine drug screen (although on further history with the patient he denies any illicit drug use), and recommended that the patient follow-up tomorrow for voiding trial in her office.  I counseled the patient on the results of the work-up.  He feels much more comfortable after the Foley has been placed.  He is stable for discharge home at this time.  He understands the plan to follow-up tomorrow.  Return precautions given, and he expresses understanding.  ____________________________________________    FINAL CLINICAL IMPRESSION(S) / ED DIAGNOSES  Final diagnoses:  Urinary retention  Hematospermia      NEW MEDICATIONS STARTED DURING THIS VISIT:  New Prescriptions   CEPHALEXIN (KEFLEX) 500 MG CAPSULE    Take 1 capsule (500 mg total) by mouth 2 (two) times daily for 7 days.     Note:  This document was prepared using Dragon voice recognition software and may include unintentional dictation errors.    Dionne BucySiadecki, Adeli Frost, MD 03/19/19 1130

## 2019-03-19 NOTE — Discharge Instructions (Signed)
Take the antibiotic as prescribed at least until you follow-up.  Keep the Foley catheter in place until you follow-up.  Follow-up tomorrow with Dr. Cherrie Gauze office.  They will be expecting you, however you can call first thing tomorrow morning to confirm when to come in.  Return to the ER for new, worsening, recurrent urinary retention, pain, testicular pain or swelling, fever, vomiting, or any other new or worsening symptoms that concern you.

## 2019-03-19 NOTE — ED Notes (Addendum)
Reports he commonly deals with blood streaked semen and urine after intercourse. States that last night, after intercourse he had blood streaked semen and a few drops of urine that was "bloody". States he has urge to urinate but is unable to do so since last night before he had intercourse. Pt reports pain to right testicle that radiates up right groin, began after ejaculation. Bladder currently distended and tender to touch. Bladder scanned in room.

## 2019-03-19 NOTE — Telephone Encounter (Signed)
I was called by the ER about this patient.  He developed acute urinary retention of unclear etiology.  He had a little over 300 cc in his bladder and a Foley catheter was placed.  Urine tox was negative.  Given his young age, it seems reasonable to try a voiding trial tomorrow (Thursday).  Please arrange this as a nurse visit tomorrow.  He is a patient of Dr. Bernardo Heater, should be seen and evaluated in 2 to 3 weeks for this episode of urinary retention.  Hollice Espy, MD

## 2019-03-19 NOTE — ED Triage Notes (Signed)
Pt presents to ED via POV, states last time he urinated was yesterday around lunch time. Reports last night attempted to urinate after intercourse and noted blood and semen coming from his penis.

## 2019-03-20 ENCOUNTER — Ambulatory Visit (INDEPENDENT_AMBULATORY_CARE_PROVIDER_SITE_OTHER): Payer: 59 | Admitting: *Deleted

## 2019-03-20 ENCOUNTER — Encounter: Payer: Self-pay | Admitting: Urology

## 2019-03-20 DIAGNOSIS — R339 Retention of urine, unspecified: Secondary | ICD-10-CM

## 2019-03-20 DIAGNOSIS — R361 Hematospermia: Secondary | ICD-10-CM

## 2019-03-20 DIAGNOSIS — R319 Hematuria, unspecified: Secondary | ICD-10-CM

## 2019-03-20 NOTE — Addendum Note (Signed)
Addended by: Verlene Mayer A on: 03/20/2019 09:53 AM   Modules accepted: Orders

## 2019-03-20 NOTE — Progress Notes (Addendum)
Fill and Pull Catheter Removal  Patient is present today for a catheter removal.  Urine was dark red in color. Patient was cleaned and prepped in a sterile fashion 174ml of sterile water/ saline was instilled into the bladder when the patient felt the urge to urinate. 86ml of water was then drained from the balloon.  A 16FR foley cath was removed from the bladder no complications were noted .  Patient as then given some time to void on their own.  Patient can void  72ml on their own after some time, urine was light red/small clots present.   Preformed by: Blondell Reveal  Follow up/ Additional notes: Return this afternoon for PVR   Per Dr. Bernardo Heater recommended Cystoscopy and CT Urogram.

## 2019-03-26 ENCOUNTER — Ambulatory Visit (INDEPENDENT_AMBULATORY_CARE_PROVIDER_SITE_OTHER): Payer: 59

## 2019-03-26 ENCOUNTER — Other Ambulatory Visit: Payer: Self-pay

## 2019-03-26 VITALS — BP 152/103 | HR 80 | Ht 70.0 in | Wt 220.0 lb

## 2019-03-26 DIAGNOSIS — R339 Retention of urine, unspecified: Secondary | ICD-10-CM

## 2019-03-26 LAB — BLADDER SCAN AMB NON-IMAGING: Scan Result: 358

## 2019-03-26 NOTE — Progress Notes (Signed)
Bladder Scan Patient cannot void: 358 ml Performed By: Elberta Leatherwood, CMA   Simple Catheter Placement  Due to urinary retention patient is present today for a foley cath placement.  Patient was cleaned and prepped in a sterile fashion with betadine and lidocaine jelly 2% was instilled into the urethra.  A 16 FR foley catheter was inserted, urine return was noted  359ml, urine was orange in color.  The balloon was filled with 10cc of sterile water.  A leg bag was attached for drainage Patient was given instruction on proper catheter care.  Patient tolerated well, no complications were noted   Preformed by: Elberta Leatherwood, CMA  Additional notes/ Follow up: 1 week

## 2019-03-26 NOTE — Addendum Note (Signed)
Addended by: Kyra Manges on: 03/26/2019 08:30 AM   Modules accepted: Orders

## 2019-03-26 NOTE — Progress Notes (Signed)
Fill and Pull Catheter Removal  Patient is present today for a catheter removal.  Patient was cleaned and prepped in a sterile fashion 224ml of sterile water/ saline was instilled into the bladder when the patient felt the urge to urinate. 11ml of water was then drained from the balloon.  A 18FR foley cath was removed from the bladder no complications were noted .  Patient as then given some time to void on their own.  Patient can void  245ml on their own after some time.  Patient tolerated well.  Preformed by: Shawnie Dapper, CMA

## 2019-03-31 ENCOUNTER — Ambulatory Visit
Admission: RE | Admit: 2019-03-31 | Discharge: 2019-03-31 | Disposition: A | Discharge status: Home / Self Care | Payer: 59 | Source: Ambulatory Visit | Attending: Urology | Admitting: Urology

## 2019-03-31 ENCOUNTER — Other Ambulatory Visit: Payer: Self-pay

## 2019-03-31 DIAGNOSIS — R361 Hematospermia: Secondary | ICD-10-CM | POA: Diagnosis present

## 2019-03-31 DIAGNOSIS — R319 Hematuria, unspecified: Secondary | ICD-10-CM | POA: Diagnosis not present

## 2019-03-31 MED ORDER — IOHEXOL 300 MG/ML  SOLN
150.0000 mL | Freq: Once | INTRAMUSCULAR | Status: AC | PRN
  Administered 2019-03-31: 150 mL via INTRAVENOUS

## 2019-04-03 ENCOUNTER — Other Ambulatory Visit: Payer: Self-pay

## 2019-04-03 ENCOUNTER — Ambulatory Visit (INDEPENDENT_AMBULATORY_CARE_PROVIDER_SITE_OTHER): Payer: 59 | Admitting: Urology

## 2019-04-03 ENCOUNTER — Encounter: Payer: Self-pay | Admitting: Urology

## 2019-04-03 VITALS — BP 112/73 | HR 64 | Ht 70.0 in | Wt 220.0 lb

## 2019-04-03 DIAGNOSIS — R361 Hematospermia: Secondary | ICD-10-CM

## 2019-04-03 DIAGNOSIS — R319 Hematuria, unspecified: Secondary | ICD-10-CM

## 2019-04-03 DIAGNOSIS — R339 Retention of urine, unspecified: Secondary | ICD-10-CM

## 2019-04-03 MED ORDER — LIDOCAINE HCL URETHRAL/MUCOSAL 2 % EX GEL
1.0000 "application " | Freq: Once | CUTANEOUS | Status: AC
  Administered 2019-04-03: 1 via URETHRAL

## 2019-04-03 NOTE — Progress Notes (Signed)
   04/03/19  CC:  Chief Complaint  Patient presents with  . Cysto    HPI: 33 year old male initially seen in October 2018 for hematospermia intermittently present since age 38.  He had a normal PSA.  Pelvic MRI was recommended however declined by his insurance company along with a CT.  A transrectal ultrasound of the prostate was performed which showed no anatomic abnormalities/cyst or abnormalities the seminal vesicles.  Prostate volume was calculated at 25 cc.  He had intercourse on the evening of 6/30 and with attempting to urinate noted only "blood and semen".  He was subsequently unable to void and presented to the ED.  Bladder scan showed 350 cc of urine in the bladder.  Urine was amber on catheter placement with >50 RBC and 21-50 WBCs.  His catheter was left indwelling and he returned to our office on the morning of 7/2 for voiding trial however had to have the catheter replaced later that day.  He had a successful voiding trial on 7/8.  CT and cystoscopy was recommended.  CTU performed on 03/31/2019 showed no upper tract abnormalities.  Prostate and seminal vesicles were normal in appearance.   Blood pressure 112/73, pulse 64, height 5\' 10"  (1.778 m), weight 220 lb (99.8 kg). NED. A&Ox3.   No respiratory distress   Abd soft, NT, ND Normal phallus with bilateral descended testicles  Cystoscopy Procedure Note  Patient identification was confirmed, informed consent was obtained, and patient was prepped using Betadine solution.  Lidocaine jelly was administered per urethral meatus.     Pre-Procedure: - Inspection reveals a normal caliber urethral meatus.  Procedure: The flexible cystoscope was introduced without difficulty - No urethral strictures/lesions are present. 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  No significant abnormalities on CT urogram or cystoscopy.  No further evaluation at this time.  He was instructed to call for worsening hematospermia or recurrent urinary retention post ejaculation.  Return in about 6 months (around 10/04/2019) for Recheck.  Abbie Sons, MD

## 2019-04-08 ENCOUNTER — Encounter: Payer: Self-pay | Admitting: Urology

## 2019-04-29 ENCOUNTER — Other Ambulatory Visit: Payer: 59 | Admitting: Urology

## 2019-05-20 NOTE — Telephone Encounter (Signed)
error 

## 2019-08-18 ENCOUNTER — Other Ambulatory Visit: Payer: Self-pay

## 2019-08-18 DIAGNOSIS — Z20822 Contact with and (suspected) exposure to covid-19: Secondary | ICD-10-CM

## 2019-08-20 ENCOUNTER — Telehealth: Payer: Self-pay | Admitting: *Deleted

## 2019-08-20 NOTE — Telephone Encounter (Signed)
Patient called for results ,still pending advised to call back. 

## 2019-08-21 LAB — NOVEL CORONAVIRUS, NAA: SARS-CoV-2, NAA: NOT DETECTED

## 2019-09-29 ENCOUNTER — Ambulatory Visit: Payer: 59 | Attending: Internal Medicine

## 2019-10-07 ENCOUNTER — Ambulatory Visit: Payer: 59 | Admitting: Urology

## 2019-10-08 ENCOUNTER — Encounter: Payer: Self-pay | Admitting: Urology

## 2019-10-08 ENCOUNTER — Ambulatory Visit (INDEPENDENT_AMBULATORY_CARE_PROVIDER_SITE_OTHER): Payer: 59 | Admitting: Urology

## 2019-10-08 ENCOUNTER — Other Ambulatory Visit: Payer: Self-pay

## 2019-10-08 VITALS — BP 132/90 | HR 72 | Ht 70.0 in | Wt 238.0 lb

## 2019-10-08 DIAGNOSIS — N529 Male erectile dysfunction, unspecified: Secondary | ICD-10-CM

## 2019-10-08 MED ORDER — TADALAFIL 20 MG PO TABS: ORAL_TABLET | ORAL | 0 refills | Status: DC

## 2019-10-08 NOTE — Progress Notes (Signed)
10/08/2019 9:39 AM   William Curtis [birth year] [id number]  Referring provider: Marisue Ivan, MD [phone] [facility] [address] [facility] [place],  [place] [address]  Chief Complaint  Patient presents with  . Erectile Dysfunction    HPI: 34 y.o. male presents for follow-up of significant hematospermia.  He was having periodic episodes of hematospermia 1 resulting in urinary retention.  Findings were negative including TRUS/prostate, CT urogram and cystoscopy.  His precipitating factor appeared to be "rough sex".  He was last seen in July for cystoscopy and denies recurrent episodes however since changing his sexual pattern does have some difficulty maintaining an erection which he will often lose prior to orgasm.  Denies flank, abdominal, pelvic pain or gross hematuria.  He has no voiding complaints.   PMH: Past Medical History:  Diagnosis Date  . Renal disorder   . TBI (traumatic brain injury) [facility])     Surgical History: History reviewed. No pertinent surgical history.  Home Medications:  Allergies as of 10/08/2019      Reactions   Zolmitriptan Other (See Comments)   Chest tightness and numbness in side Chest tightness   Ciprofloxacin Rash   Oxycodone-acetaminophen Rash      Medication List       Accurate as of October 08, 2019  9:39 AM. If you have any questions, ask your nurse or doctor.        busPIRone 30 MG tablet Commonly known as: BUSPAR Take 30 mg by mouth 2 (two) times daily.   PARoxetine 30 MG tablet Commonly known as: PAXIL Take 30 mg by mouth daily.       Allergies:  Allergies  Allergen Reactions  . Zolmitriptan Other (See Comments)    Chest tightness and numbness in side Chest tightness  . Ciprofloxacin Rash  . Oxycodone-Acetaminophen Rash    Family History: History reviewed. No pertinent family history.  Social History:  reports that he has never smoked. He has never used smokeless tobacco. He reports previous alcohol use.  He reports previous drug use.  ROS: UROLOGY Frequent Urination?: No Hard to postpone urination?: No Burning/pain with urination?: No Get up at night to urinate?: No Leakage of urine?: No Urine stream starts and stops?: No Trouble starting stream?: No Do you have to strain to urinate?: No Blood in urine?: Yes Urinary tract infection?: No Sexually transmitted disease?: No Injury to kidneys or bladder?: No Painful intercourse?: No Weak stream?: No Erection problems?: Yes Penile pain?: No  Gastrointestinal Nausea?: No Vomiting?: No Indigestion/heartburn?: No Diarrhea?: No Constipation?: No  Constitutional Fever: No Night sweats?: No Weight loss?: No Fatigue?: No  Skin Skin rash/lesions?: No Itching?: No  Eyes Blurred vision?: No Double vision?: No  Ears/Nose/Throat Sore throat?: No Sinus problems?: No  Hematologic/Lymphatic Swollen glands?: No Easy bruising?: No  Cardiovascular Leg swelling?: No Chest pain?: No  Respiratory Cough?: No Shortness of breath?: No  Endocrine Excessive thirst?: No  Musculoskeletal Back pain?: No Joint pain?: No  Neurological Headaches?: No Dizziness?: No  Psychologic Depression?: Yes Anxiety?: Yes  Physical Exam: BP 132/90   Pulse 72   Ht 5\' 10"  (1.778 m)   Wt 238 lb (108 kg)   BMI 34.15 kg/m   Constitutional:  Alert and oriented, No acute distress. HEENT: [place] AT, moist mucus membranes.  Trachea midline, no masses. Cardiovascular: No clubbing, cyanosis, or edema. Respiratory: Normal respiratory effort, no increased work of breathing. Skin: No rashes, bruises or suspicious lesions. Neurologic: Grossly intact, no focal deficits, moving all  4 extremities. Psychiatric: Normal mood and affect.   Assessment & Plan:    - Hematospermia Resolved  - Erectile dysfunction He was interested in a PDE 5 inhibitor trial and Rx generic tadalafil sent to pharmacy.  Follow-up prn recurrent hematospermia.  If  tadalafil effective we will see if Dr. Netty Starring will do annual refills.   Abbie Sons, [place] [address] [place], [place] [address] [phone]

## 2019-10-08 NOTE — Patient Instructions (Signed)
Return as needed

## 2020-01-18 IMAGING — CT CT ABDOMEN AND PELVIS WITHOUT AND WITH CONTRAST
2 of 8 series · 13 of 46 positions shown, 18 images · IV contrast (omnipaque)
Comparison: 03/17/2010

CLINICAL DATA: Hematuria

EXAM:
CT ABDOMEN AND PELVIS WITHOUT AND WITH CONTRAST
TECHNIQUE: Multidetector CT imaging of the abdomen and pelvis was performed
following the standard protocol before and following the bolus
administration of intravenous contrast.
CONTRAST:  150mL OMNIPAQUE IOHEXOL 300 MG/ML  SOLN

[Series 5: coronal without pre · coronal · non-contrast · 0.80mm/px · 3 of 149 slices shown]
[im 38/149  soft-tissue]
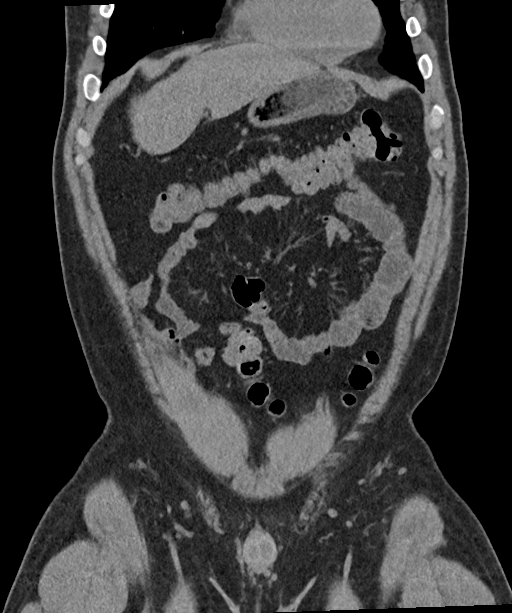
[im 75/149  soft-tissue]
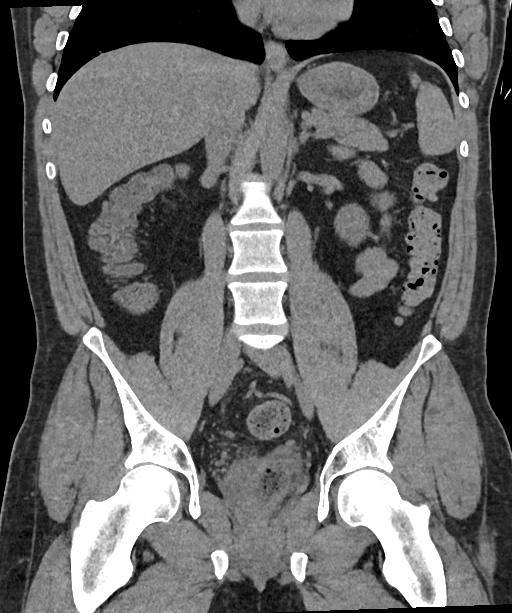
[im 112/149  soft-tissue]
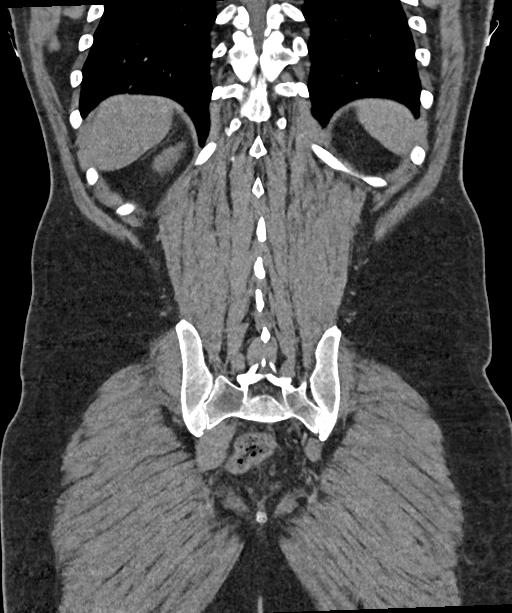

[Series 10: axial delay prone · axial · delayed · 0.80mm/px · z∈[-1410,-980]mm · 10 of 104 slices shown, 15 images]
[im 9/104  soft-tissue]
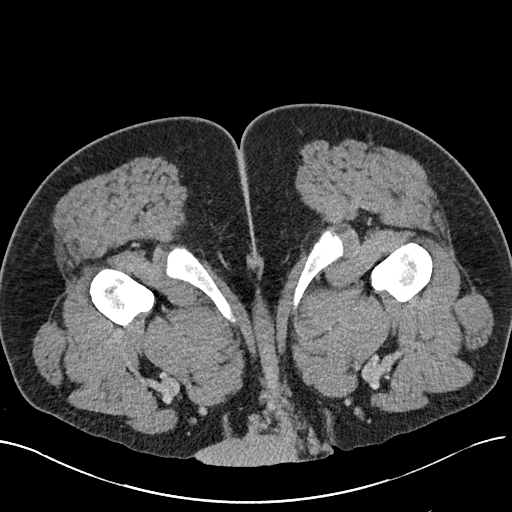
[im 9/104  bone]
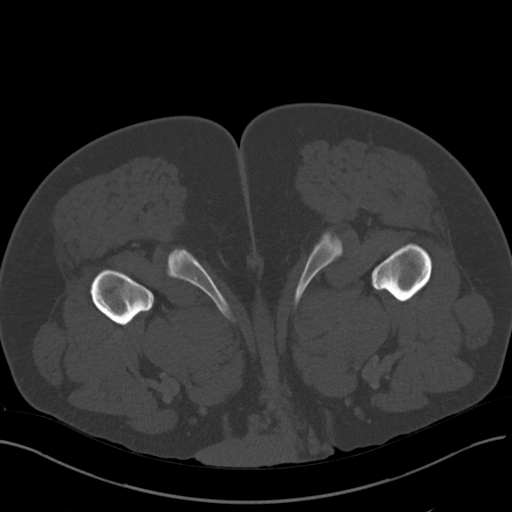
[im 18/104  soft-tissue]
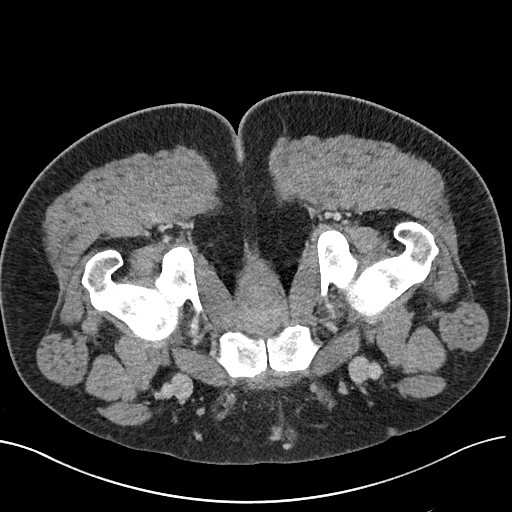
[im 35/104  soft-tissue]
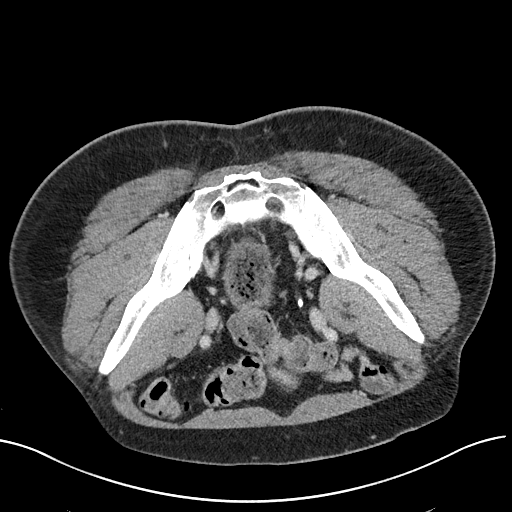
[im 43/104  soft-tissue]
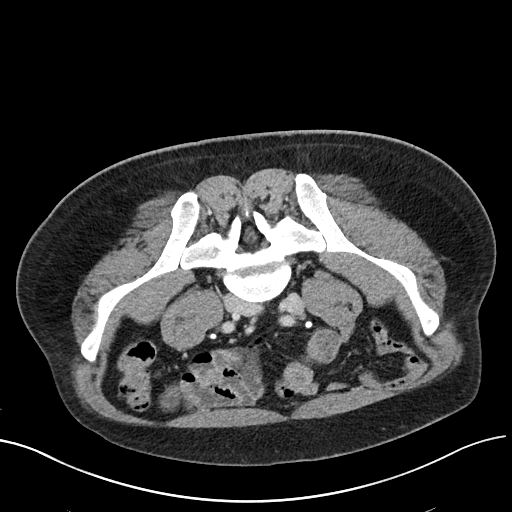
[im 52/104  soft-tissue]
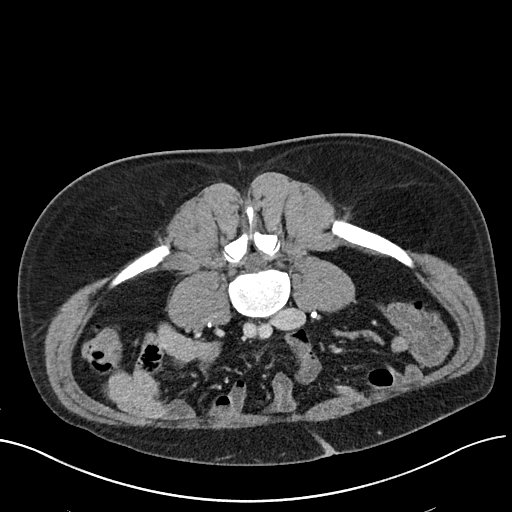
[im 61/104  soft-tissue]
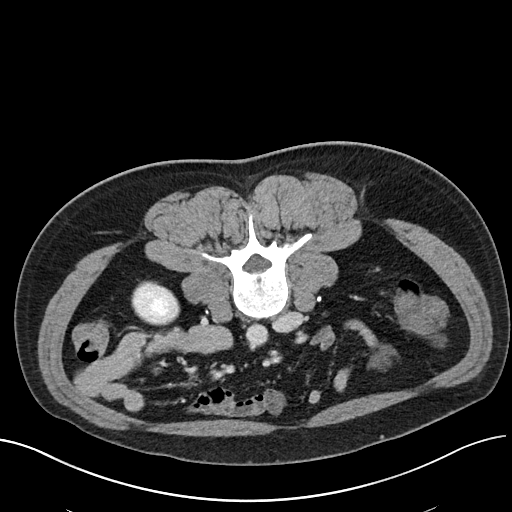
[im 69/104  soft-tissue]
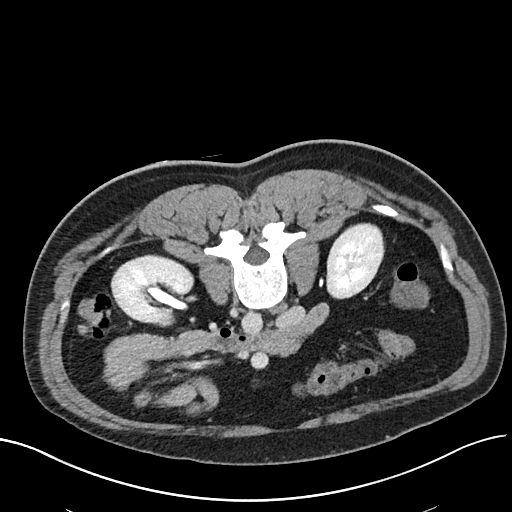
[im 69/104  lung]
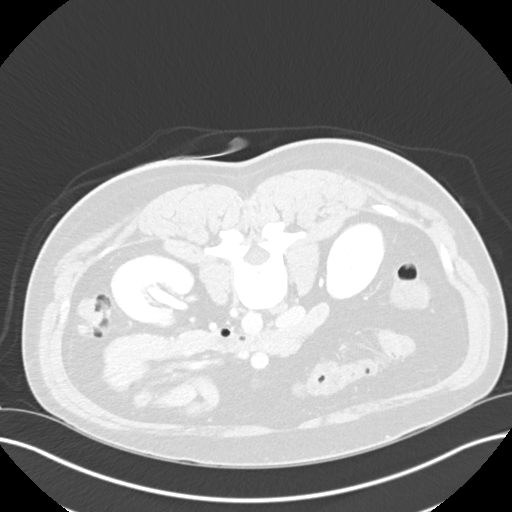
[im 78/104  lung]
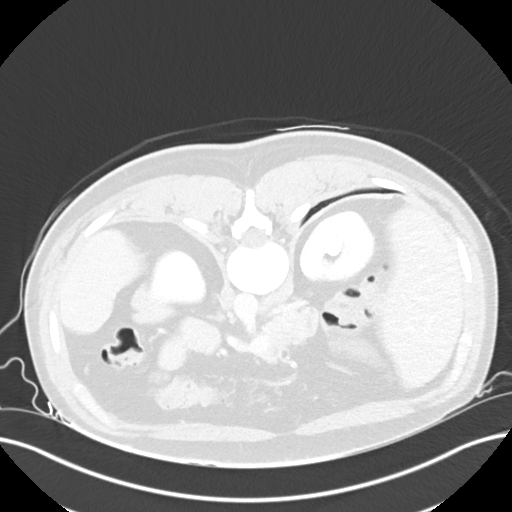
[im 86/104  soft-tissue]
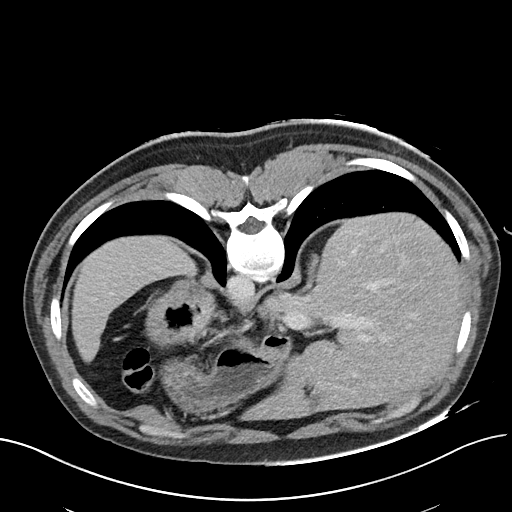
[im 86/104  lung]
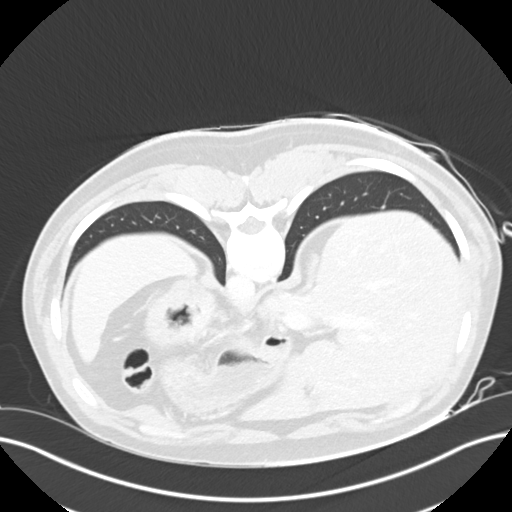
[im 95/104  soft-tissue]
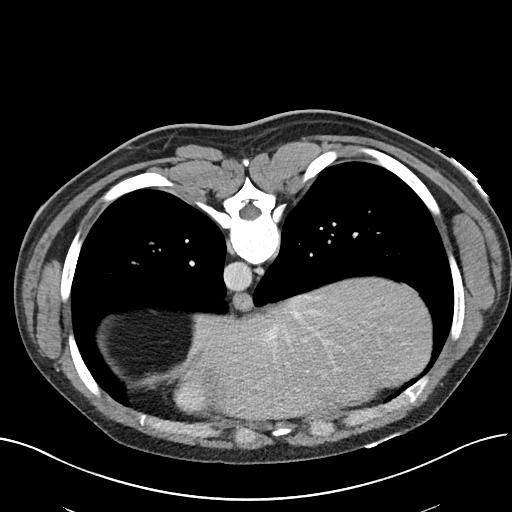
[im 95/104  lung]
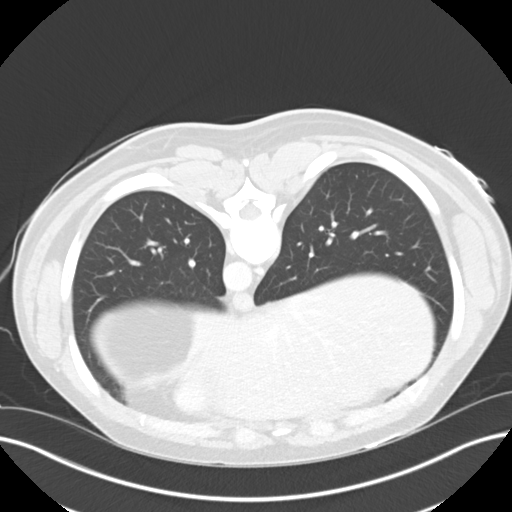
[im 95/104  bone]
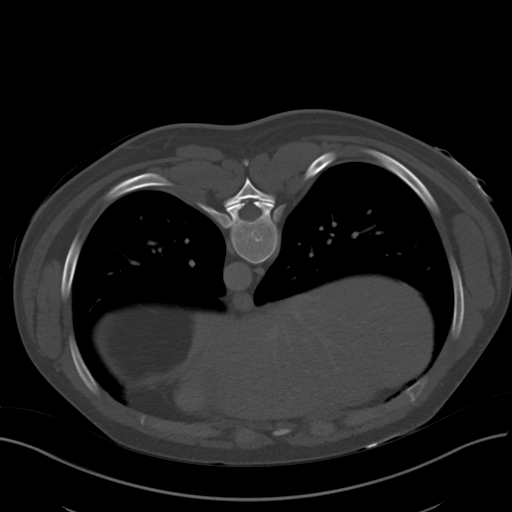

[13 of 46 positions shown; findings below may reference images not displayed]

FINDINGS: Lower chest: Unremarkable.

Hepatobiliary: No suspicious focal abnormality within the liver
parenchyma. There is no evidence for gallstones, gallbladder wall
thickening, or pericholecystic fluid. No intrahepatic or
extrahepatic biliary dilation.

Pancreas: No focal mass lesion. No dilatation of the main duct. No
intraparenchymal cyst. No peripancreatic edema.

Spleen: No splenomegaly. No focal mass lesion.

Adrenals/Urinary Tract: No adrenal nodule or mass.

Precontrast imaging shows no stones in either kidney or ureter. No
ureteral or bladder stones.

Imaging after IV contrast administration shows no suspicious
enhancing lesion in either kidney. No wall thickening or soft tissue
filling defect in either intrarenal collecting system or renal
pelvis. Right ureter well opacified and normal in appearance. Left
ureter incompletely opacified but shows no focal dilatation, wall
thickening, or mass lesion. No focal bladder wall abnormality.

Stomach/Bowel: Stomach is unremarkable. No gastric wall thickening.
No evidence of outlet obstruction. Duodenum is normally positioned
as is the ligament of Treitz. No small bowel wall thickening. No
small bowel dilatation. The terminal ileum is normal. The appendix
is normal. No gross colonic mass. No colonic wall thickening.

Vascular/Lymphatic: No abdominal aortic aneurysm. No abdominal
aortic atherosclerotic calcification. There is no gastrohepatic or
hepatoduodenal ligament lymphadenopathy. No intraperitoneal or
retroperitoneal lymphadenopathy. No pelvic sidewall lymphadenopathy.

Reproductive: The prostate gland and seminal vesicles are
unremarkable.

Other: No intraperitoneal free fluid.

Musculoskeletal: No worrisome lytic or sclerotic osseous
abnormality.
IMPRESSION: 1. Unremarkable study. Specifically, no findings to explain the
patient's history of hematuria.

## 2020-04-13 ENCOUNTER — Other Ambulatory Visit: Payer: Self-pay | Admitting: Surgery

## 2020-04-21 ENCOUNTER — Other Ambulatory Visit
Admission: RE | Admit: 2020-04-21 | Discharge: 2020-04-21 | Disposition: A | Discharge status: Home / Self Care | Payer: Managed Care, Other (non HMO) | Source: Ambulatory Visit | Attending: Surgery | Admitting: Surgery

## 2020-04-21 ENCOUNTER — Other Ambulatory Visit: Payer: Self-pay

## 2020-04-21 DIAGNOSIS — Z01812 Encounter for preprocedural laboratory examination: Secondary | ICD-10-CM | POA: Diagnosis not present

## 2020-04-21 HISTORY — DX: Gastro-esophageal reflux disease without esophagitis: K21.9

## 2020-04-21 HISTORY — DX: Anxiety disorder, unspecified: F41.9

## 2020-04-21 HISTORY — DX: Depression, unspecified: F32.A

## 2020-04-21 HISTORY — DX: Rhabdomyolysis: M62.82

## 2020-04-21 NOTE — Patient Instructions (Signed)
Your procedure is scheduled on: 04-28-20 [facility] Report to Same Day Surgery 2nd floor medical mall [facility] Entrance-take elevator on left to 2nd floor.  Check in with surgery information desk.) To find out your arrival time please call [phone] between 1PM - 3PM on 04-27-20 TUESDAY  Remember: Instructions that are not followed completely may result in serious medical risk, up to and including death, or upon the discretion of your surgeon and anesthesiologist your surgery may need to be rescheduled.    _x___ 1. Do not eat food after midnight the night before your procedure. NO GUM OR CANDY AFTER MIDNIGHT. You may drink clear liquids up to 2 hours before you are scheduled to arrive at the hospital for your procedure.  Do not drink clear liquids within 2 hours of your scheduled arrival to the hospital.  Clear liquids include  --Water or Apple juice without pulp  --Gatorade  --Black Coffee or Clear Tea (No milk, no creamers, do not add anything to the coffee or Tea-OK TO ADD SUGAR)  _X___Ensure clear carbohydrate drink-FINISH DRINK 2 HOURS PRIOR TO ARRIVAL TIME TO HOSPITAL THE DAY OF YOUR SURGERY    __x__ 2. No Alcohol for 24 hours before or after surgery.   __x__3. No Smoking or e-cigarettes for 24 prior to surgery.  Do not use any chewable tobacco products for at least 6 hour prior to surgery   ____  4. Bring all medications with you on the day of surgery if instructed.    __x__ 5. Notify your doctor if there is any change in your medical condition     (cold, fever, infections).    x___6. On the morning of surgery brush your teeth with toothpaste and water.  You may rinse your mouth with mouth wash if you wish.  Do not swallow any toothpaste or mouthwash.   Do not wear jewelry, make-up, hairpins, clips or nail polish.  Do not wear lotions, powders, or perfumes.   Do not shave 48 hours prior to surgery. Men may shave face and neck.  Do not bring valuables to the hospital.     [facility] is not responsible for any belongings or valuables.               Contacts, dentures or bridgework may not be worn into surgery.  Leave your suitcase in the car. After surgery it may be brought to your room.  For patients admitted to the hospital, discharge time is determined by your  treatment team.  _  Patients discharged the day of surgery will not be allowed to drive home.  You will need someone to drive you home and stay with you the night of your procedure.    Please read over the following fact sheets that you were given:   [facility] Preparing for Surgery/INCENTIVE SPIROMETER INSTRUCTIONS-YOU WILL BE GIVEN THE INCENTIVE SPIROMETER DEVICE THE DAY OF YOUR SURGERY  _x___ TAKE THE FOLLOWING MEDICATION THE MORNING OF SURGERY WITH A SMALL SIP OF WATER. These include:  1. BUSPAR (BUSPIRONE)  2.  3.  4.  5.  6.  ____Fleets enema or Magnesium Citrate as directed.   _x___ Use CHG Soap as directed on instruction sheet   ____ Use inhalers on the day of surgery and bring to hospital day of surgery  ____ Stop Metformin and Janumet 2 days prior to surgery.    ____ Take 1/2 of usual insulin dose the night before surgery and none on the morning surgery.  ____ Follow recommendations from Cardiologist, Pulmonologist or PCP regarding stopping Aspirin, Coumadin, Plavix ,Eliquis, Effient, or Pradaxa, and Pletal.  X____Stop Anti-inflammatories such as Advil, Aleve, Ibuprofen, Motrin, Naproxen, Naprosyn, Goodies powders or aspirin products NOW-OK to take Tylenol    ____ Stop supplements until after surgery.     ____ Bring C-Pap to the hospital.

## 2020-04-26 ENCOUNTER — Other Ambulatory Visit
Admission: RE | Admit: 2020-04-26 | Discharge: 2020-04-26 | Disposition: A | Discharge status: Home / Self Care | Payer: Managed Care, Other (non HMO) | Source: Ambulatory Visit | Attending: Surgery | Admitting: Surgery

## 2020-04-26 ENCOUNTER — Other Ambulatory Visit: Payer: Self-pay

## 2020-04-26 DIAGNOSIS — Z20822 Contact with and (suspected) exposure to covid-19: Secondary | ICD-10-CM | POA: Diagnosis not present

## 2020-04-26 DIAGNOSIS — Z01812 Encounter for preprocedural laboratory examination: Secondary | ICD-10-CM | POA: Diagnosis not present

## 2020-04-27 LAB — SARS CORONAVIRUS 2 (TAT 6-24 HRS): SARS Coronavirus 2: NEGATIVE

## 2020-04-28 ENCOUNTER — Ambulatory Visit
Admission: RE | Admit: 2020-04-28 | Discharge: 2020-04-28 | Disposition: A | Discharge status: Home / Self Care | Payer: Managed Care, Other (non HMO) | Attending: Surgery | Admitting: Surgery

## 2020-04-28 ENCOUNTER — Ambulatory Visit: Payer: Managed Care, Other (non HMO) | Admitting: Anesthesiology

## 2020-04-28 ENCOUNTER — Encounter: Payer: Self-pay | Admitting: Surgery

## 2020-04-28 ENCOUNTER — Encounter
Admission: RE | Disposition: A | Discharge status: Home / Self Care | Payer: Self-pay | Source: Home / Self Care | Attending: Surgery

## 2020-04-28 ENCOUNTER — Other Ambulatory Visit: Payer: Self-pay

## 2020-04-28 DIAGNOSIS — Z79899 Other long term (current) drug therapy: Secondary | ICD-10-CM | POA: Diagnosis not present

## 2020-04-28 DIAGNOSIS — F329 Major depressive disorder, single episode, unspecified: Secondary | ICD-10-CM | POA: Diagnosis not present

## 2020-04-28 DIAGNOSIS — I10 Essential (primary) hypertension: Secondary | ICD-10-CM | POA: Insufficient documentation

## 2020-04-28 DIAGNOSIS — F419 Anxiety disorder, unspecified: Secondary | ICD-10-CM | POA: Diagnosis not present

## 2020-04-28 DIAGNOSIS — G5601 Carpal tunnel syndrome, right upper limb: Secondary | ICD-10-CM | POA: Insufficient documentation

## 2020-04-28 HISTORY — PX: CARPAL TUNNEL RELEASE: SHX101

## 2020-04-28 SURGERY — RELEASE, CARPAL TUNNEL, ENDOSCOPIC
Anesthesia: General | Laterality: Right

## 2020-04-28 MED ORDER — MIDAZOLAM HCL 2 MG/2ML IJ SOLN
INTRAMUSCULAR | Status: AC
  Filled 2020-04-28: qty 2

## 2020-04-28 MED ORDER — CEFAZOLIN SODIUM-DEXTROSE 2-4 GM/100ML-% IV SOLN
2.0000 g | INTRAVENOUS | Status: AC
  Administered 2020-04-28: 2 g via INTRAVENOUS

## 2020-04-28 MED ORDER — PROPOFOL 10 MG/ML IV BOLUS
INTRAVENOUS | Status: DC | PRN
  Administered 2020-04-28: 200 mg via INTRAVENOUS

## 2020-04-28 MED ORDER — LIDOCAINE HCL (PF) 2 % IJ SOLN
INTRAMUSCULAR | Status: AC
  Filled 2020-04-28: qty 5

## 2020-04-28 MED ORDER — FAMOTIDINE 20 MG PO TABS: 20.0000 mg | ORAL_TABLET | Freq: Once | ORAL | Status: AC

## 2020-04-28 MED ORDER — PHENYLEPHRINE HCL (PRESSORS) 10 MG/ML IV SOLN
INTRAVENOUS | Status: AC
  Filled 2020-04-28: qty 1

## 2020-04-28 MED ORDER — ONDANSETRON HCL 4 MG/2ML IJ SOLN: 4.0000 mg | Freq: Once | INTRAMUSCULAR | Status: DC | PRN

## 2020-04-28 MED ORDER — LIDOCAINE HCL (CARDIAC) PF 100 MG/5ML IV SOSY
PREFILLED_SYRINGE | INTRAVENOUS | Status: DC | PRN
  Administered 2020-04-28: 100 mg via INTRAVENOUS

## 2020-04-28 MED ORDER — FAMOTIDINE 20 MG PO TABS
ORAL_TABLET | ORAL | Status: AC
  Administered 2020-04-28: 20 mg via ORAL
  Filled 2020-04-28: qty 1

## 2020-04-28 MED ORDER — ORAL CARE MOUTH RINSE: 15.0000 mL | Freq: Once | OROMUCOSAL | Status: AC

## 2020-04-28 MED ORDER — LACTATED RINGERS IV SOLN: INTRAVENOUS | Status: DC

## 2020-04-28 MED ORDER — ONDANSETRON HCL 4 MG/2ML IJ SOLN
INTRAMUSCULAR | Status: DC | PRN
  Administered 2020-04-28: 4 mg via INTRAVENOUS

## 2020-04-28 MED ORDER — CEFAZOLIN SODIUM-DEXTROSE 2-4 GM/100ML-% IV SOLN
INTRAVENOUS | Status: AC
  Filled 2020-04-28: qty 100

## 2020-04-28 MED ORDER — FENTANYL CITRATE (PF) 100 MCG/2ML IJ SOLN
INTRAMUSCULAR | Status: DC | PRN
  Administered 2020-04-28: 25 ug via INTRAVENOUS
  Administered 2020-04-28: 50 ug via INTRAVENOUS
  Administered 2020-04-28: 25 ug via INTRAVENOUS

## 2020-04-28 MED ORDER — BUPIVACAINE HCL (PF) 0.5 % IJ SOLN
INTRAMUSCULAR | Status: AC
  Filled 2020-04-28: qty 30

## 2020-04-28 MED ORDER — DEXAMETHASONE SODIUM PHOSPHATE 10 MG/ML IJ SOLN
INTRAMUSCULAR | Status: DC | PRN
  Administered 2020-04-28: 8 mg via INTRAVENOUS

## 2020-04-28 MED ORDER — FENTANYL CITRATE (PF) 100 MCG/2ML IJ SOLN
INTRAMUSCULAR | Status: AC
  Filled 2020-04-28: qty 2

## 2020-04-28 MED ORDER — PROPOFOL 10 MG/ML IV BOLUS
INTRAVENOUS | Status: AC
  Filled 2020-04-28: qty 60

## 2020-04-28 MED ORDER — TRAMADOL HCL 50 MG PO TABS: 50.0000 mg | ORAL_TABLET | Freq: Four times a day (QID) | ORAL | 0 refills | Status: DC | PRN

## 2020-04-28 MED ORDER — FENTANYL CITRATE (PF) 100 MCG/2ML IJ SOLN: 25.0000 ug | INTRAMUSCULAR | Status: DC | PRN

## 2020-04-28 MED ORDER — CHLORHEXIDINE GLUCONATE 0.12 % MT SOLN: 15.0000 mL | Freq: Once | OROMUCOSAL | Status: AC

## 2020-04-28 MED ORDER — CHLORHEXIDINE GLUCONATE 0.12 % MT SOLN
OROMUCOSAL | Status: AC
  Administered 2020-04-28: 15 mL via OROMUCOSAL
  Filled 2020-04-28: qty 15

## 2020-04-28 MED ORDER — MIDAZOLAM HCL 2 MG/2ML IJ SOLN
INTRAMUSCULAR | Status: DC | PRN
  Administered 2020-04-28: 2 mg via INTRAVENOUS

## 2020-04-28 MED ORDER — BUPIVACAINE HCL (PF) 0.5 % IJ SOLN
INTRAMUSCULAR | Status: DC | PRN
  Administered 2020-04-28: 10 mL

## 2020-04-28 SURGICAL SUPPLY — 32 items
APL PRP STRL LF DISP 70% ISPRP (MISCELLANEOUS) ×1
BNDG COHESIVE 4X5 TAN STRL (GAUZE/BANDAGES/DRESSINGS) ×2 IMPLANT
BNDG ELASTIC 2X5.8 VLCR STR LF (GAUZE/BANDAGES/DRESSINGS) ×2 IMPLANT
BNDG ESMARK 4X12 TAN STRL LF (GAUZE/BANDAGES/DRESSINGS) ×2 IMPLANT
CANISTER SUCT 1200ML W/VALVE (MISCELLANEOUS) ×2 IMPLANT
CHLORAPREP W/TINT 26 (MISCELLANEOUS) ×2 IMPLANT
CORD BIP STRL DISP 12FT (MISCELLANEOUS) ×2 IMPLANT
COVER WAND RF STERILE (DRAPES) ×2 IMPLANT
CUFF TOURN SGL QUICK 18X4 (TOURNIQUET CUFF) ×2 IMPLANT
DRAPE SURG 17X11 SM STRL (DRAPES) ×2 IMPLANT
FORCEPS JEWEL BIP 4-3/4 STR (INSTRUMENTS) ×2 IMPLANT
GAUZE SPONGE 4X4 12PLY STRL (GAUZE/BANDAGES/DRESSINGS) ×2 IMPLANT
GAUZE XEROFORM 1X8 LF (GAUZE/BANDAGES/DRESSINGS) ×2 IMPLANT
GLOVE BIO SURGEON STRL SZ8 (GLOVE) ×2 IMPLANT
GLOVE INDICATOR 8.0 STRL GRN (GLOVE) ×2 IMPLANT
GOWN STRL REUS W/ TWL LRG LVL3 (GOWN DISPOSABLE) ×2 IMPLANT
GOWN STRL REUS W/ TWL XL LVL3 (GOWN DISPOSABLE) ×1 IMPLANT
GOWN STRL REUS W/TWL LRG LVL3 (GOWN DISPOSABLE) ×4
GOWN STRL REUS W/TWL XL LVL3 (GOWN DISPOSABLE) ×2
KIT CARPAL TUNNEL (MISCELLANEOUS) ×2
KIT ESCP INSRT D SLOT CANN KN (MISCELLANEOUS) ×1 IMPLANT
KIT TURNOVER KIT A (KITS) ×2 IMPLANT
NS IRRIG 500ML POUR BTL (IV SOLUTION) ×2 IMPLANT
PACK EXTREMITY (MISCELLANEOUS) ×2 IMPLANT
SPLINT WRIST LG LT TX990309 (SOFTGOODS) IMPLANT
SPLINT WRIST LG RT TX900304 (SOFTGOODS) ×2 IMPLANT
SPLINT WRIST M LT TX990308 (SOFTGOODS) IMPLANT
SPLINT WRIST M RT TX990303 (SOFTGOODS) IMPLANT
SPLINT WRIST XL LT TX990310 (SOFTGOODS) IMPLANT
SPLINT WRIST XL RT TX990305 (SOFTGOODS) IMPLANT
STOCKINETTE IMPERVIOUS 9X36 MD (GAUZE/BANDAGES/DRESSINGS) ×2 IMPLANT
SUT PROLENE 4 0 PS 2 18 (SUTURE) ×2 IMPLANT

## 2020-04-28 NOTE — H&P (Signed)
History of Present Illness: William Curtis is a 34 y.o. male who presents today for evaluation of bilateral hand numbness and tingling an additional to radiating pain into the thumb of the bilateral upper extremities. The patient states that the pain has been ongoing for 1 year however worsening the past several months. The patient was evaluated at the walk-in clinic and did receive a prednisone taper which he finished. The patient states that the numbness did persist during the prednisone taper however it did decrease some of his discomfort. The patient is sleeping in response, he does continue to have sharp pain at night but does not wake him up. He is left-hand dominant, denies any surgical history to bilateral upper extremities. The patient works in [employer]. He reports that the numbness and tingling is in the thumb, index and middle finger bilaterally.  Past Medical History: . Anxiety  . Depression  . History of rhabdomyolysis  renal failure requiring dialysis, 2011  . History of ulcerative colitis  . Hypertension 03/14/2010  20months  . OCD (obsessive compulsive disorder)  . PTSD (post-traumatic stress disorder)  . Ulcerative colitis, chronic (CMS-HCC)   Past Surgical History: . COLONOSCOPY 2009  [facility])  . COLONOSCOPY 02/29/2016  Int Hemorrhoids, Diverticulosis: Repeat @ age 23 (02/2035)   Past Family History: . Anxiety Mother  . Stroke Father  . Anxiety Brother  . Depression Paternal Grandfather  . Glaucoma Paternal Grandmother  . Colon polyps Neg Hx  . Colon cancer Neg Hx   Medications: . busPIRone (BUSPAR) 30 MG tablet Take 1 tablet (30 mg total) by mouth 2 (two) times daily 180 tablet 0  . ketoconazole (NIZORAL) 2 % cream Apply topically once daily (Patient not taking: Reported on 03/14/2020 ) 60 g 0  . mometasone (ELOCON) 0.1 % cream Apply topically once daily For 1-2 weeks. 45 g 0  . PARoxetine (PAXIL) 30 MG tablet TAKE 1 TABLET BY MOUTH EVERY DAY IN THE  EVENING 90 tablet 1   No current Epic-ordered facility-administered medications on file.   Allergies: . Ciprofloxacin Rash  . Gabapentin Other (See Comments)  Caused increased SOB  . Vicodin [Hydrocodone-Acetaminophen] Rash  . Zolmitriptan Other (See Comments) and Unknown  Chest tightness and numbness in side  . Oxycodone-Acetaminophen Rash   Review of Systems:  A comprehensive 14 point ROS was performed, reviewed by me today, and the pertinent orthopaedic findings are documented in the HPI.  Physical Exam: BP 140/80 (BP Location: Left upper arm, Patient Position: Sitting, BP Cuff Size: Adult)  Ht 177.8 cm (5\' 10" )  Wt (!) 117.9 kg (260 lb)  BMI 37.31 kg/m  General/Constitutional: The patient appears to be well-nourished, well-developed, and in no acute distress. Neuro/Psych: Normal mood and affect, oriented to person, place and time. Eyes: Non-icteric. Pupils are equal, round, and reactive to light, and exhibit synchronous movement. ENT: Unremarkable. Lymphatic: No palpable adenopathy. Respiratory: Lungs clear to auscultation, Normal chest excursion, No wheezes and Non-labored breathing Cardiovascular: Regular rate and rhythm. No murmurs. and No edema, swelling or tenderness, except as noted in detailed exam. Integumentary: No impressive skin lesions present, except as noted in detailed exam. Musculoskeletal: Unremarkable, except as noted in detailed exam.  Neck: Neck has full range of motion. There is no tenderness to palpation. Spurling's test is negative.   Bilateral Upper Extremity: Normal shoulder contour. Good active and passive range of motion and stability of the shoulder, elbow, and wrist. Normal motion of the hand and digits. No swelling, erythema, or ecchymosis  is noted. There is no triggering or locking of the digits noted. No thenar atrophy is visualized. No intrinsic wasting. The patient has a positive Phalen's test. The patient has a positive Tinel's test. The  patient has decreased pinprick and light touch sensation in the median nerve distribution. There is normal grip strength and pincer strength. The patient has less than 2 second capillary refill with good skin warmth. Normal radial and ulnar pulse is palpated.   Neurologic: Sensory function is intact, except as noted above. Motor strength is 5/5, except as noted above. No tremor or clonus is present. Good motor coordination is noted.   Imaging: None.  Impression: Right carpal tunnel syndrome.  Plan:  1. Treatment options were discussed today with the patient. 2. The patient is experiencing numbness and tingling in the median nerve distribution bilaterally. Positive Tinel's test and compression test. 3. The patient is quite frustrated by his continued symptoms and wishes to proceed with more aggressive treatment options. 4. The risk and benefits of a endoscopic carpal tunnel release were discussed in detail with the patient. The patient voices his understanding wishes to proceed. The surgery will be performed with Dr. Joice Lofts. 5. This document will serve as a surgical history and physical for the patient. 6. The patient will follow-up per standard postop protocol. They can call the clinic they have any questions, new symptoms develop or symptoms worsen.  The procedure was discussed with the patient, as were the potential risks (including bleeding, infection, nerve and/or blood vessel injury, persistent or recurrent pain, failure of the repair, progression of arthritis, need for further surgery, blood clots, strokes, heart attacks and/or arhythmias, pneumonia, etc.) and benefits. The patient states his understanding and wishes to proceed.   H&P reviewed and patient re-examined. No changes.

## 2020-04-28 NOTE — Anesthesia Postprocedure Evaluation (Signed)
Anesthesia Post Note  Patient: William Curtis  Procedure(s) Performed: CARPAL TUNNEL RELEASE ENDOSCOPIC (Right )  Patient location during evaluation: PACU Anesthesia Type: General Level of consciousness: awake and alert Pain management: pain level controlled Vital Signs Assessment: post-procedure vital signs reviewed and stable Respiratory status: spontaneous breathing and respiratory function stable Cardiovascular status: stable Anesthetic complications: no   No complications documented.   Last Vitals:  Vitals:   04/28/20 0843 04/28/20 0848  BP: 128/87   Pulse: 82 83  Resp: 13 10  Temp:    SpO2: 98% 96%    Last Pain:  Vitals:   04/28/20 0839  TempSrc:   PainSc: 0-No pain                 [name]

## 2020-04-28 NOTE — Op Note (Signed)
04/28/2020  8:25 AM  Patient:   William Curtis  Pre-Op Diagnosis:   Right carpal tunnel syndrome.  Post-Op Diagnosis:   Same.  Procedure:   Endoscopic right carpal tunnel release.  Surgeon:   Maryagnes Amos, MD  Assistant:   Volanda Napoleon, PA-S  Anesthesia:   General LMA  Findings:   As above.  Complications:   None  EBL:   0 cc  Fluids:   600 cc crystalloid  TT:   17 minutes at 250 mmHg  Drains:   None  Closure:   4-0 Prolene interrupted sutures  Brief Clinical Note:   The patient is a 34 year old male with a history of progressively worsening bilateral hand and wrist pain and paresthesias, right more symptomatic than left. His symptoms have persisted despite medications, activity modification, etc. His history and examination are consistent with carpal tunnel syndrome. The patient presents at this time for an endoscopic right carpal tunnel release.   Procedure:   The patient was brought into the operating room and lain in the supine position. After adequate general laryngeal mask anesthesia was obtained, the right hand and upper extremity were prepped with ChloraPrep solution before being draped sterilely. Preoperative antibiotics were administered. A timeout was performed to verify the appropriate surgical site before the limb was exsanguinated with an Esmarch and the tourniquet inflated to 250 mmHg. An approximately 1.5-2 cm incision was made over the volar wrist flexion crease, centered over the palmaris longus tendon. The incision was carried down through the subcutaneous tissues with care taken to identify and protect any neurovascular structures. The distal forearm fascia was penetrated just proximal to the transverse carpal ligament. The soft tissues were released off the superficial and deep surfaces of the distal forearm fascia and this was released proximally for 3-4 cm under direct visualization.  Attention was directed distally. The Therapist, nutritional was passed  beneath the transverse carpal ligament along the ulnar aspect of the carpal tunnel and used to release any adhesions as well as to remove any adherent synovial tissue before first the smaller then the larger of the two dilators were passed beneath the transverse carpal ligament along the ulnar margin of the carpal tunnel. The slotted cannula was introduced and the endoscope was placed into the slotted cannula and the undersurface of the transverse carpal ligament visualized. The distal margin of the transverse carpal ligament was marked by placing a 25-gauge needle percutaneously at Kaplan's cardinal point so that it entered the distal portion of the slotted cannula. Under endoscopic visualization, the transverse carpal ligament was released from proximal to distal using the end-cutting blade. A second pass was performed to ensure complete release of the ligament. The adequacy of release was verified both endoscopically and by palpation using the freer elevator.  The wound was irrigated thoroughly with sterile saline solution before being closed using 4-0 Prolene interrupted sutures. A total of 10 cc of 0.5% plain Sensorcaine was injected in and around the incision before a sterile bulky dressing was applied to the wound. The patient was placed into a volar wrist splint before being awakened and returned to the recovery room in satisfactory condition after tolerating the procedure well.

## 2020-04-28 NOTE — Discharge Instructions (Addendum)
Orthopedic discharge instructions: Keep dressing dry and intact. Keep hand elevated above heart level. May shower after dressing removed on postop day 4 (Sunday). Cover sutures with Band-Aids after drying off. Apply ice to affected area frequently. Take ibuprofen 600-80 mg TID with meals for 7-10 days, then as necessary. Take ES Tylenol or pain medication as prescribed when needed.  Return for follow-up in 10-14 days or as scheduled.  AMBULATORY SURGERY  DISCHARGE INSTRUCTIONS   1) The drugs that you were given will stay in your system until tomorrow so for the next 24 hours you should not:  A) Drive an automobile B) Make any legal decisions C) Drink any alcoholic beverage   2) You may resume regular meals tomorrow.  Today it is better to start with liquids and gradually work up to solid foods.  You may eat anything you prefer, but it is better to start with liquids, then soup and crackers, and gradually work up to solid foods.   3) Please notify your doctor immediately if you have any unusual bleeding, trouble breathing, redness and pain at the surgery site, drainage, fever, or pain not relieved by medication.    4) Additional Instructions:        Please contact your physician with any problems or Same Day Surgery at [phone], Monday through Friday 6 am to 4 pm, or [place] at [facility] number at [phone].

## 2020-04-28 NOTE — Progress Notes (Signed)
Called Dr. Henrene Hawking due to patient having a headache after waking from surgery. Patient takes energy drinks and takes a Goody's powder daily therefore the headache is probably due to that. Dr. Henrene Hawking stated to give him a coke and he can have a tylenol if he needs it.

## 2020-04-28 NOTE — Transfer of Care (Signed)
Immediate Anesthesia Transfer of Care Note  Patient: William Curtis  Procedure(s) Performed: CARPAL TUNNEL RELEASE ENDOSCOPIC (Right )  Patient Location: PACU  Anesthesia Type:General  Level of Consciousness: awake, oriented, drowsy and patient cooperative  Airway & Oxygen Therapy: Patient Spontanous Breathing  Post-op Assessment: Report given to RN and Post -op Vital signs reviewed and stable  Post vital signs: Reviewed and stable  Last Vitals:  Vitals Value Taken Time  BP 131/82 04/28/20 0827  Temp 36.3 C 04/28/20 0827  Pulse 83 04/28/20 0827  Resp 13 04/28/20 0827  SpO2 96 % 04/28/20 0827  Vitals shown include unvalidated device data.  Last Pain:  Vitals:   04/28/20 0619  TempSrc: Tympanic  PainSc: 2          Complications: No complications documented.

## 2020-04-28 NOTE — Anesthesia Preprocedure Evaluation (Signed)
Anesthesia Evaluation  Patient identified by MRN, date of birth, ID band Patient awake    Reviewed: Allergy & Precautions, NPO status , Patient's Chart, lab work & pertinent test results  History of Anesthesia Complications Negative for: history of anesthetic complications  Airway Mallampati: III       Dental   Pulmonary neg sleep apnea, neg COPD, Not current smoker,           Cardiovascular (-) hypertension(-) Past MI and (-) CHF (-) dysrhythmias (-) Valvular Problems/Murmurs     Neuro/Psych neg Seizures Anxiety Depression    GI/Hepatic Neg liver ROS, GERD  ,  Endo/Other  neg diabetes  Renal/GU negative Renal ROS     Musculoskeletal   Abdominal   Peds  Hematology   Anesthesia Other Findings   Reproductive/Obstetrics                             Anesthesia Physical Anesthesia Plan  ASA: II  Anesthesia Plan: General   Post-op Pain Management:    Induction: Intravenous  PONV Risk Score and Plan: 2 and Ondansetron and Dexamethasone  Airway Management Planned: LMA  Additional Equipment:   Intra-op Plan:   Post-operative Plan:   Informed Consent: I have reviewed the patients History and Physical, chart, labs and discussed the procedure including the risks, benefits and alternatives for the proposed anesthesia with the patient or authorized representative who has indicated his/her understanding and acceptance.       Plan Discussed with:   Anesthesia Plan Comments:         Anesthesia Quick Evaluation

## 2020-04-28 NOTE — Anesthesia Procedure Notes (Signed)
Procedure Name: LMA Insertion Date/Time: 04/28/2020 7:42 AM Performed by: Henrietta Hoover, CRNA Pre-anesthesia Checklist: Patient identified, Emergency Drugs available, Patient being monitored and Suction available Patient Re-evaluated:Patient Re-evaluated prior to induction Oxygen Delivery Method: Circle system utilized Preoxygenation: Pre-oxygenation with 100% oxygen Induction Type: IV induction LMA: LMA inserted LMA Size: 4.0 Number of attempts: 1 Placement Confirmation: positive ETCO2 and breath sounds checked- equal and bilateral Tube secured with: Tape Dental Injury: Teeth and Oropharynx as per pre-operative assessment

## 2020-12-29 ENCOUNTER — Other Ambulatory Visit: Payer: Self-pay | Admitting: Surgery

## 2021-01-03 ENCOUNTER — Encounter
Admission: RE | Admit: 2021-01-03 | Discharge: 2021-01-03 | Disposition: A | Discharge status: Home / Self Care | Payer: Managed Care, Other (non HMO) | Source: Ambulatory Visit | Attending: Surgery | Admitting: Surgery

## 2021-01-03 ENCOUNTER — Other Ambulatory Visit: Payer: Self-pay

## 2021-01-03 HISTORY — DX: Sleep apnea, unspecified: G47.30

## 2021-01-03 HISTORY — DX: Chronic kidney disease, unspecified: N18.9

## 2021-01-03 NOTE — Patient Instructions (Addendum)
Your procedure is scheduled on: 01/12/21 Report to DAY SURGERY DEPARTMENT LOCATED ON 2ND FLOOR MEDICAL MALL ENTRANCE. To find out your arrival time please call [phone] between 1PM - 3PM on 01/11/21.  Remember: Instructions that are not followed completely may result in serious medical risk, up to and including death, or upon the discretion of your surgeon and anesthesiologist your surgery may need to be rescheduled.     _X__ 1. Do not eat food after midnight the night before your procedure.                 No gum chewing or hard candies. You may drink clear liquids up to 2 hours                 before you are scheduled to arrive for your surgery- DO not drink clear                 liquids within 2 hours of the start of your surgery.                 Clear Liquids include:  water, apple juice without pulp, clear carbohydrate                 drink such as Clearfast or Gatorade, Black Coffee or Tea (Do not add                 anything to coffee or tea). Diabetics water only  __X__2.  On the morning of surgery brush your teeth with toothpaste and water, you                 may rinse your mouth with mouthwash if you wish.  Do not swallow any              toothpaste of mouthwash.     _X__ 3.  No Alcohol for 24 hours before or after surgery.   _X__ 4.  Do Not Smoke or use e-cigarettes For 24 Hours Prior to Your Surgery.                 Do not use any chewable tobacco products for at least 6 hours prior to                 surgery.  ____  5.  Bring all medications with you on the day of surgery if instructed.   __X__  6.  Notify your doctor if there is any change in your medical condition      (cold, fever, infections).     Do not wear jewelry, make-up, hairpins, clips or nail polish. Do not wear lotions, powders, or perfumes.  Do not shave 48 hours prior to surgery. Men may shave face and neck. Do not bring valuables to the hospital.    [facility] is not responsible for any belongings or  valuables.  Contacts, dentures/partials or body piercings may not be worn into surgery. Bring a case for your contacts, glasses or hearing aids, a denture cup will be supplied. Leave your suitcase in the car. After surgery it may be brought to your room. For patients admitted to the hospital, discharge time is determined by your treatment team.   Patients discharged the day of surgery will not be allowed to drive home.   Please read over the following fact sheets that you were given:   MRSA Information  __X__ Take these medicines the morning of surgery with A SIP OF WATER:  1. busPIRone (BUSPAR) 30 MG tablet IF NEEDED  2.   3.   4.  5.  6.  ____ Fleet Enema (as directed)   __X__ Use CHG Soap/SAGE wipes as directed  ____ Use inhalers on the day of surgery  ____ Stop metformin/Janumet/Farxiga 2 days prior to surgery    ____ Take 1/2 of usual insulin dose the night before surgery. No insulin the morning          of surgery.   ____ Stop Blood Thinners Coumadin/Plavix/Xarelto/Pleta/Pradaxa/Eliquis/Effient/Aspirin  on   Or contact your Surgeon, Cardiologist or Medical Doctor regarding  ability to stop your blood thinners  __X__ Stop Anti-inflammatories 7 days before surgery such as Advil, Ibuprofen, Motrin,  BC or Goodies Powder, Naprosyn, Naproxen, Aleve, Aspirin   YOU MAY TAKE TYLENOL IF NEEDED  __X__ Stop all herbal supplements, fish oil or vitamin E until after surgery.    __X__ Bring C-Pap/A-PAP to the hospital.

## 2021-01-10 ENCOUNTER — Other Ambulatory Visit: Payer: Self-pay

## 2021-01-10 ENCOUNTER — Other Ambulatory Visit
Admission: RE | Admit: 2021-01-10 | Discharge: 2021-01-10 | Disposition: A | Discharge status: Home / Self Care | Payer: Managed Care, Other (non HMO) | Source: Ambulatory Visit | Attending: Surgery | Admitting: Surgery

## 2021-01-10 DIAGNOSIS — Z20822 Contact with and (suspected) exposure to covid-19: Secondary | ICD-10-CM | POA: Diagnosis not present

## 2021-01-10 DIAGNOSIS — Z01812 Encounter for preprocedural laboratory examination: Secondary | ICD-10-CM | POA: Diagnosis present

## 2021-01-11 LAB — SARS CORONAVIRUS 2 (TAT 6-24 HRS): SARS Coronavirus 2: NEGATIVE

## 2021-01-11 MED ORDER — CEFAZOLIN SODIUM-DEXTROSE 2-4 GM/100ML-% IV SOLN
2.0000 g | INTRAVENOUS | Status: AC
  Administered 2021-01-12: 2 g via INTRAVENOUS

## 2021-01-11 MED ORDER — ORAL CARE MOUTH RINSE: 15.0000 mL | Freq: Once | OROMUCOSAL | Status: AC

## 2021-01-11 MED ORDER — FAMOTIDINE 20 MG PO TABS: 20.0000 mg | ORAL_TABLET | Freq: Once | ORAL | Status: AC

## 2021-01-11 MED ORDER — CHLORHEXIDINE GLUCONATE 0.12 % MT SOLN: 15.0000 mL | Freq: Once | OROMUCOSAL | Status: AC

## 2021-01-11 MED ORDER — LACTATED RINGERS IV SOLN: INTRAVENOUS | Status: DC

## 2021-01-12 ENCOUNTER — Other Ambulatory Visit: Payer: Self-pay

## 2021-01-12 ENCOUNTER — Encounter: Payer: Self-pay | Admitting: Surgery

## 2021-01-12 ENCOUNTER — Ambulatory Visit: Payer: Managed Care, Other (non HMO) | Admitting: Certified Registered"

## 2021-01-12 ENCOUNTER — Encounter
Admission: RE | Disposition: A | Discharge status: Home / Self Care | Payer: Self-pay | Source: Home / Self Care | Attending: Surgery

## 2021-01-12 ENCOUNTER — Ambulatory Visit
Admission: RE | Admit: 2021-01-12 | Discharge: 2021-01-12 | Disposition: A | Discharge status: Home / Self Care | Payer: Managed Care, Other (non HMO) | Attending: Surgery | Admitting: Surgery

## 2021-01-12 DIAGNOSIS — Z79899 Other long term (current) drug therapy: Secondary | ICD-10-CM | POA: Insufficient documentation

## 2021-01-12 DIAGNOSIS — Z885 Allergy status to narcotic agent status: Secondary | ICD-10-CM | POA: Insufficient documentation

## 2021-01-12 DIAGNOSIS — G5602 Carpal tunnel syndrome, left upper limb: Secondary | ICD-10-CM | POA: Insufficient documentation

## 2021-01-12 DIAGNOSIS — Z888 Allergy status to other drugs, medicaments and biological substances status: Secondary | ICD-10-CM | POA: Diagnosis not present

## 2021-01-12 DIAGNOSIS — Z881 Allergy status to other antibiotic agents status: Secondary | ICD-10-CM | POA: Insufficient documentation

## 2021-01-12 HISTORY — PX: CARPAL TUNNEL RELEASE: SHX101

## 2021-01-12 SURGERY — RELEASE, CARPAL TUNNEL, ENDOSCOPIC
Anesthesia: General | Site: Wrist | Laterality: Left

## 2021-01-12 MED ORDER — FENTANYL CITRATE (PF) 100 MCG/2ML IJ SOLN
25.0000 ug | INTRAMUSCULAR | Status: DC | PRN
Start: 2021-01-12 — End: 2021-01-12

## 2021-01-12 MED ORDER — ONDANSETRON HCL 4 MG/2ML IJ SOLN
INTRAMUSCULAR | Status: AC
  Filled 2021-01-12: qty 2

## 2021-01-12 MED ORDER — ONDANSETRON HCL 4 MG/2ML IJ SOLN: 4.0000 mg | Freq: Once | INTRAMUSCULAR | Status: DC | PRN

## 2021-01-12 MED ORDER — PROPOFOL 10 MG/ML IV BOLUS
INTRAVENOUS | Status: AC
  Filled 2021-01-12: qty 20

## 2021-01-12 MED ORDER — FAMOTIDINE 20 MG PO TABS
ORAL_TABLET | ORAL | Status: AC
  Administered 2021-01-12: 20 mg via ORAL
  Filled 2021-01-12: qty 1

## 2021-01-12 MED ORDER — PROPOFOL 10 MG/ML IV BOLUS
INTRAVENOUS | Status: DC | PRN
  Administered 2021-01-12: 200 mg via INTRAVENOUS
  Administered 2021-01-12: 100 mg via INTRAVENOUS
  Administered 2021-01-12: 50 mg via INTRAVENOUS

## 2021-01-12 MED ORDER — CEFAZOLIN SODIUM-DEXTROSE 2-4 GM/100ML-% IV SOLN
INTRAVENOUS | Status: AC
  Filled 2021-01-12: qty 100

## 2021-01-12 MED ORDER — DEXMEDETOMIDINE (PRECEDEX) IN NS 20 MCG/5ML (4 MCG/ML) IV SYRINGE
PREFILLED_SYRINGE | INTRAVENOUS | Status: DC | PRN
  Administered 2021-01-12 (×2): 8 ug via INTRAVENOUS

## 2021-01-12 MED ORDER — BUPIVACAINE HCL (PF) 0.5 % IJ SOLN
INTRAMUSCULAR | Status: DC | PRN
  Administered 2021-01-12: 10 mL

## 2021-01-12 MED ORDER — MIDAZOLAM HCL 2 MG/2ML IJ SOLN
INTRAMUSCULAR | Status: DC | PRN
  Administered 2021-01-12: 2 mg via INTRAVENOUS

## 2021-01-12 MED ORDER — BUPIVACAINE HCL (PF) 0.5 % IJ SOLN
INTRAMUSCULAR | Status: AC
  Filled 2021-01-12: qty 30

## 2021-01-12 MED ORDER — CHLORHEXIDINE GLUCONATE 0.12 % MT SOLN
OROMUCOSAL | Status: AC
  Administered 2021-01-12: 15 mL via OROMUCOSAL
  Filled 2021-01-12: qty 15

## 2021-01-12 MED ORDER — ONDANSETRON HCL 4 MG/2ML IJ SOLN
INTRAMUSCULAR | Status: DC | PRN
  Administered 2021-01-12: 4 mg via INTRAVENOUS

## 2021-01-12 MED ORDER — FENTANYL CITRATE (PF) 100 MCG/2ML IJ SOLN
INTRAMUSCULAR | Status: AC
  Filled 2021-01-12: qty 2

## 2021-01-12 MED ORDER — MIDAZOLAM HCL 2 MG/2ML IJ SOLN
INTRAMUSCULAR | Status: AC
  Filled 2021-01-12: qty 2

## 2021-01-12 MED ORDER — HYDROCODONE-ACETAMINOPHEN 5-325 MG PO TABS: 1.0000 | ORAL_TABLET | Freq: Four times a day (QID) | ORAL | 0 refills | Status: DC | PRN

## 2021-01-12 MED ORDER — FENTANYL CITRATE (PF) 100 MCG/2ML IJ SOLN
INTRAMUSCULAR | Status: DC | PRN
  Administered 2021-01-12: 50 ug via INTRAVENOUS

## 2021-01-12 SURGICAL SUPPLY — 33 items
APL PRP STRL LF DISP 70% ISPRP (MISCELLANEOUS) ×1
BNDG COHESIVE 4X5 TAN STRL (GAUZE/BANDAGES/DRESSINGS) ×2 IMPLANT
BNDG ELASTIC 2X5.8 VLCR STR LF (GAUZE/BANDAGES/DRESSINGS) ×2 IMPLANT
BNDG ESMARK 4X12 TAN STRL LF (GAUZE/BANDAGES/DRESSINGS) ×2 IMPLANT
CANISTER SUCT 1200ML W/VALVE (MISCELLANEOUS) ×2 IMPLANT
CHLORAPREP W/TINT 26 (MISCELLANEOUS) ×2 IMPLANT
CORD BIP STRL DISP 12FT (MISCELLANEOUS) ×2 IMPLANT
COVER WAND RF STERILE (DRAPES) ×2 IMPLANT
CUFF TOURN SGL QUICK 18X4 (TOURNIQUET CUFF) ×2 IMPLANT
DRAPE SURG 17X11 SM STRL (DRAPES) ×2 IMPLANT
FORCEPS JEWEL BIP 4-3/4 STR (INSTRUMENTS) ×2 IMPLANT
GAUZE SPONGE 4X4 12PLY STRL (GAUZE/BANDAGES/DRESSINGS) ×2 IMPLANT
GAUZE XEROFORM 1X8 LF (GAUZE/BANDAGES/DRESSINGS) ×2 IMPLANT
GLOVE SURG ENC MOIS LTX SZ8 (GLOVE) ×2 IMPLANT
GLOVE SURG UNDER LTX SZ8 (GLOVE) ×2 IMPLANT
GOWN STRL REUS W/ TWL LRG LVL3 (GOWN DISPOSABLE) ×1 IMPLANT
GOWN STRL REUS W/ TWL XL LVL3 (GOWN DISPOSABLE) ×1 IMPLANT
GOWN STRL REUS W/TWL LRG LVL3 (GOWN DISPOSABLE) ×2
GOWN STRL REUS W/TWL XL LVL3 (GOWN DISPOSABLE) ×2
KIT CARPAL TUNNEL (MISCELLANEOUS) ×2
KIT ESCP INSRT D SLOT CANN KN (MISCELLANEOUS) ×1 IMPLANT
KIT TURNOVER KIT A (KITS) ×2 IMPLANT
MANIFOLD NEPTUNE II (INSTRUMENTS) ×2 IMPLANT
NS IRRIG 500ML POUR BTL (IV SOLUTION) ×2 IMPLANT
PACK EXTREMITY ARMC (MISCELLANEOUS) ×2 IMPLANT
SPLINT WRIST LG LT TX990309 (SOFTGOODS) IMPLANT
SPLINT WRIST LG RT TX900304 (SOFTGOODS) IMPLANT
SPLINT WRIST M LT TX990308 (SOFTGOODS) IMPLANT
SPLINT WRIST M RT TX990303 (SOFTGOODS) IMPLANT
SPLINT WRIST XL LT TX990310 (SOFTGOODS) ×2 IMPLANT
SPLINT WRIST XL RT TX990305 (SOFTGOODS) IMPLANT
STOCKINETTE IMPERVIOUS 9X36 MD (GAUZE/BANDAGES/DRESSINGS) ×2 IMPLANT
SUT PROLENE 4 0 PS 2 18 (SUTURE) ×2 IMPLANT

## 2021-01-12 NOTE — Transfer of Care (Signed)
Immediate Anesthesia Transfer of Care Note  Patient: William Curtis  Procedure(s) Performed: CARPAL TUNNEL RELEASE ENDOSCOPIC (Left Wrist)  Patient Location: PACU  Anesthesia Type:General  Level of Consciousness: drowsy  Airway & Oxygen Therapy: Patient Spontanous Breathing and Patient connected to face mask oxygen  Post-op Assessment: Report given to RN and Post -op Vital signs reviewed and stable  Post vital signs: Reviewed and stable  Last Vitals:  Vitals Value Taken Time  BP 91/52 01/12/21 0931  Temp    Pulse 80 01/12/21 0935  Resp 16 01/12/21 0935  SpO2 99 % 01/12/21 0935  Vitals shown include unvalidated device data.  Last Pain:  Vitals:   01/12/21 0758  TempSrc: Temporal  PainSc: 0-No pain      Patients Stated Pain Goal: 0 (01/12/21 0758)  Complications: No complications documented.

## 2021-01-12 NOTE — Anesthesia Preprocedure Evaluation (Signed)
Anesthesia Evaluation  Patient identified by MRN, date of birth, ID band Patient awake    Reviewed: Allergy & Precautions, H&P , NPO status , Patient's Chart, lab work & pertinent test results, reviewed documented beta blocker date and time   Airway Mallampati: II  TM Distance: >3 FB Neck ROM: full    Dental  (+) Teeth Intact   Pulmonary sleep apnea and Continuous Positive Airway Pressure Ventilation ,    Pulmonary exam normal        Cardiovascular Exercise Tolerance: Good negative cardio ROS Normal cardiovascular exam Rate:Normal     Neuro/Psych PSYCHIATRIC DISORDERS Anxiety Depression  Neuromuscular disease    GI/Hepatic Neg liver ROS, GERD  Medicated,  Endo/Other  negative endocrine ROS  Renal/GU Renal disease  negative genitourinary   Musculoskeletal   Abdominal   Peds  Hematology negative hematology ROS (+)   Anesthesia Other Findings   Reproductive/Obstetrics negative OB ROS                             Anesthesia Physical Anesthesia Plan  ASA: III  Anesthesia Plan: General LMA   Post-op Pain Management:    Induction:   PONV Risk Score and Plan: 3  Airway Management Planned:   Additional Equipment:   Intra-op Plan:   Post-operative Plan:   Informed Consent: I have reviewed the patients History and Physical, chart, labs and discussed the procedure including the risks, benefits and alternatives for the proposed anesthesia with the patient or authorized representative who has indicated his/her understanding and acceptance.       Plan Discussed with: CRNA  Anesthesia Plan Comments:         Anesthesia Quick Evaluation

## 2021-01-12 NOTE — Op Note (Signed)
01/12/2021  9:41 AM  Patient:   William Curtis  Pre-Op Diagnosis:   Left carpal tunnel syndrome.  Post-Op Diagnosis:   Same.  Procedure:   Endoscopic left carpal tunnel release.  Surgeon:   Maryagnes Amos, MD  Assistant:   Brigid Re, PA-S  Anesthesia:   General LMA  Findings:   As above.  Complications:   None  EBL:   0 cc  Fluids:   400 cc crystalloid  TT:   19 minutes at 250 mmHg  Drains:   None  Closure:   4-0 Prolene interrupted sutures  Brief Clinical Note:   The patient is a 35 year old male with a long history of progressively worsening pain and paresthesias to his left hand. The symptoms have persisted despite medications, activity modification, splinting, etc. His history and examination are consistent with left carpal tunnel syndrome. The patient presents at this time for an endoscopic left carpal tunnel release.   Procedure:   The patient was brought into the operating room and lain in the supine position. After adequate general laryngeal mask anesthesia was obtained, the left hand and upper extremity were prepped with ChloraPrep solution before being draped sterilely. Preoperative antibiotics were administered. A timeout was performed to verify the appropriate surgical site before the limb was exsanguinated with an Esmarch and the tourniquet inflated to 250 mmHg. An approximately 1.5-2 cm incision was made over the volar wrist flexion crease, centered over the palmaris longus tendon. The incision was carried down through the subcutaneous tissues with care taken to identify and protect any neurovascular structures. The distal forearm fascia was penetrated just proximal to the transverse carpal ligament. The soft tissues were released off the superficial and deep surfaces of the distal forearm fascia and this was released proximally for 3-4 cm under direct visualization.  Attention was directed distally. The Therapist, nutritional was passed beneath the transverse carpal  ligament along the ulnar aspect of the carpal tunnel and used to release any adhesions as well as to remove any adherent synovial tissue before first the smaller then the larger of the two dilators were passed beneath the transverse carpal ligament along the ulnar margin of the carpal tunnel. The slotted cannula was introduced and the endoscope was placed into the slotted cannula and the undersurface of the transverse carpal ligament visualized. The distal margin of the transverse carpal ligament was marked by placing a 25-gauge needle percutaneously at Kaplan's cardinal point so that it entered the distal portion of the slotted cannula. Under endoscopic visualization, the transverse carpal ligament was released from proximal to distal using the end-cutting blade. A second pass was performed to ensure complete release of the ligament. The adequacy of release was verified both endoscopically and by palpation using the freer elevator.  The wound was irrigated thoroughly with sterile saline solution before being closed using 4-0 Prolene interrupted sutures. A total of 10 cc of 0.5% plain Sensorcaine was injected in and around the incision before a sterile bulky dressing was applied to the wound. The patient was placed into a volar wrist splint before being awakened, extubated, and returned to the recovery room in satisfactory condition after tolerating the procedure well.

## 2021-01-12 NOTE — Discharge Instructions (Addendum)
AMBULATORY SURGERY  DISCHARGE INSTRUCTIONS   1) The drugs that you were given will stay in your system until tomorrow so for the next 24 hours you should not:  A) Drive an automobile B) Make any legal decisions C) Drink any alcoholic beverage   2) You may resume regular meals tomorrow.  Today it is better to start with liquids and gradually work up to solid foods.  You may eat anything you prefer, but it is better to start with liquids, then soup and crackers, and gradually work up to solid foods.   3) Please notify your doctor immediately if you have any unusual bleeding, trouble breathing, redness and pain at the surgery site, drainage, fever, or pain not relieved by medication.    4) Additional Instructions:        Please contact your physician with any problems or Same Day Surgery at [phone], Monday through Friday 6 am to 4 pm, or [place] at [facility] number at [phone].   Orthopedic discharge instructions: Keep dressing dry and intact. Keep hand elevated above heart level. May shower after dressing removed on postop day 4 (Sunday). Cover sutures with Band-Aids after drying off, then reapply Velcro splint. Apply ice to affected area frequently. Take ibuprofen 600-800 mg TID with meals for 7-10 days, then as necessary. Take ES Tylenol or pain medication as prescribed when needed.  Return for follow-up in 10-14 days or as scheduled. 

## 2021-01-12 NOTE — H&P (Signed)
History of Present Illness: William Curtis is a 35 y.o. male who presents today for evaluation of bilateral hand numbness and tingling an additional to radiating pain into the thumb of the bilateral upper extremities. The patient states that the pain has been ongoing for 1 year however worsening the past several months. The patient was evaluated at the walk-in clinic and did receive a prednisone taper which he finished. The patient states that the numbness did persist during the prednisone taper however it did decrease some of his discomfort. The patient is sleeping in response, he does continue to have sharp pain at night but does not wake him up. He is left-hand dominant, denies any surgical history to bilateral upper extremities. The patient works in [employer]. He reports that the numbness and tingling is in the thumb, index and middle finger bilaterally.  Past Medical History: . Anxiety  . Depression  . History of rhabdomyolysis (renal failure requiring dialysis, 2011)  . History of ulcerative colitis  . Hypertension 03/14/2010 (6 months)  . OCD (obsessive compulsive disorder)  . PTSD (post-traumatic stress disorder)  . Ulcerative colitis, chronic (CMS-HCC)   Past Surgical History: . COLONOSCOPY 2009 [facility])  . COLONOSCOPY 02/29/2016 (Int Hemorrhoids, Diverticulosis: Repeat @ age 51 (02/2035)) . Endoscopic right carpal tunnel release 03/2020 (Dr. Joice Lofts)  Past Family History: . Anxiety Mother  . Stroke Father  . Anxiety Brother  . Depression Paternal Grandfather  . Glaucoma Paternal Grandmother  . Colon polyps Neg Hx  . Colon cancer Neg Hx   Medications: . busPIRone (BUSPAR) 30 MG tablet Take 1 tablet (30 mg total) by mouth 2 (two) times daily 180 tablet 0  . ketoconazole (NIZORAL) 2 % cream Apply topically once daily (Patient not taking: Reported on 03/14/2020 ) 60 g 0  . mometasone (ELOCON) 0.1 % cream Apply topically once daily For 1-2 weeks. 45 g 0  . PARoxetine  (PAXIL) 30 MG tablet TAKE 1 TABLET BY MOUTH EVERY DAY IN THE EVENING 90 tablet 1   Allergies: . Ciprofloxacin Rash  . Gabapentin Other (Caused increased SOB)  . Vicodin [Hydrocodone-Acetaminophen] Rash  . Zolmitriptan Other (Chest tightness and numbness in side) . Oxycodone-Acetaminophen Rash   Review of Systems:  A comprehensive 14 point ROS was performed, reviewed by me today, and the pertinent orthopaedic findings are documented in the HPI.  Physical Exam: BP 140/80 (BP Location: Left upper arm, Patient Position: Sitting, BP Cuff Size: Adult)  Ht 177.8 cm (5\' 10" )  Wt (!) 117.9 kg (260 lb)  BMI 37.31 kg/m  General/Constitutional: The patient appears to be well-nourished, well-developed, and in no acute distress. Neuro/Psych: Normal mood and affect, oriented to person, place and time. Eyes: Non-icteric. Pupils are equal, round, and reactive to light, and exhibit synchronous movement. ENT: Unremarkable. Lymphatic: No palpable adenopathy. Respiratory: Lungs clear to auscultation, Normal chest excursion, No wheezes and Non-labored breathing Cardiovascular: Regular rate and rhythm. No murmurs. and No edema, swelling or tenderness, except as noted in detailed exam. Integumentary: No impressive skin lesions present, except as noted in detailed exam. Musculoskeletal: Unremarkable, except as noted in detailed exam.  Neck: Neck has full range of motion. There is no tenderness to palpation. Spurling's test is negative.   Left wrist/hand exam: Normal shoulder contour. Good active and passive range of motion and stability of the shoulder, elbow, and wrist. Normal motion of the hand and digits. No swelling, erythema, or ecchymosis is noted. There is no triggering or locking of the digits noted. No  thenar atrophy is visualized. No intrinsic wasting. The patient has a positive Phalen's test. The patient has a positive Tinel's test. The patient has decreased pinprick and light touch sensation in  the median nerve distribution. There is normal grip strength and pincer strength. The patient has less than 2 second capillary refill with good skin warmth. Normal radial and ulnar pulse is palpated.   Neurologic: Sensory function is intact, except as noted above. Motor strength is 5/5, except as noted above. No tremor or clonus is present. Good motor coordination is noted.   X-ray results: Recent x-rays of the left hand and wrist are available for review and have been reviewed by myself.  These films demonstrate no evidence for fractures, lytic lesions, or significant degenerative changes.  Impression: Left carpal tunnel syndrome.  Plan:  1. Treatment options were discussed today with the patient. 2. The patient is experiencing numbness and tingling in the median nerve distribution bilaterally. Positive Tinel's test and compression test. 3. The patient is quite frustrated by his continued symptoms and wishes to proceed with more aggressive treatment options. 4. The risk and benefits of a endoscopic carpal tunnel release were discussed in detail with the patient. The patient voices his understanding wishes to proceed. The surgery will be performed with Dr. Joice Lofts. 5. This document will serve as a surgical history and physical for the patient. 6. The patient will follow-up per standard postop protocol. They can call the clinic they have any questions, new symptoms develop or symptoms worsen.  The procedure was discussed with the patient, as were the potential risks (including bleeding, infection, nerve and/or blood vessel injury, persistent or recurrent pain, failure of the repair, progression of arthritis, need for further surgery, blood clots, strokes, heart attacks and/or arhythmias, pneumonia, etc.) and benefits. The patient states his understanding and wishes to proceed.    H&P reviewed and patient re-examined. No changes.

## 2021-01-12 NOTE — Anesthesia Procedure Notes (Signed)
Procedure Name: LMA Insertion Date/Time: 01/12/2021 8:48 AM Performed by: Gilford Raid, CRNA Pre-anesthesia Checklist: Patient identified, Patient being monitored, Timeout performed, Emergency Drugs available and Suction available Patient Re-evaluated:Patient Re-evaluated prior to induction Oxygen Delivery Method: Circle system utilized Preoxygenation: Pre-oxygenation with 100% oxygen Induction Type: IV induction Ventilation: Mask ventilation without difficulty LMA: LMA inserted LMA Size: 5.0 Tube type: Oral Number of attempts: 1 Placement Confirmation: positive ETCO2 and breath sounds checked- equal and bilateral Tube secured with: Tape Dental Injury: Teeth and Oropharynx as per pre-operative assessment

## 2021-01-13 ENCOUNTER — Encounter: Payer: Self-pay | Admitting: Surgery

## 2021-01-13 NOTE — Anesthesia Postprocedure Evaluation (Signed)
Anesthesia Post Note  Patient: William Curtis  Procedure(s) Performed: CARPAL TUNNEL RELEASE ENDOSCOPIC (Left Wrist)  Patient location during evaluation: PACU Anesthesia Type: General Level of consciousness: awake and alert Pain management: pain level controlled Vital Signs Assessment: post-procedure vital signs reviewed and stable Respiratory status: spontaneous breathing, nonlabored ventilation, respiratory function stable and patient connected to nasal cannula oxygen Cardiovascular status: blood pressure returned to baseline and stable Postop Assessment: no apparent nausea or vomiting Anesthetic complications: no   No complications documented.   Last Vitals:  Vitals:   01/12/21 1030 01/12/21 1045  BP: 112/75 110/74  Pulse: 65 (!) 57  Resp: 12 14  Temp:  (!) 36.2 C  SpO2: 99% 97%    Last Pain:  Vitals:   01/13/21 0934  TempSrc:   PainSc: 0-No pain                 Yevette Edwards

## 2023-05-19 ENCOUNTER — Emergency Department
Admission: EM | Admit: 2023-05-19 | Discharge: 2023-05-19 | Disposition: A | Discharge status: Home / Self Care | Payer: No Typology Code available for payment source | Attending: Emergency Medicine | Admitting: Emergency Medicine

## 2023-05-19 DIAGNOSIS — S6992XA Unspecified injury of left wrist, hand and finger(s), initial encounter: Secondary | ICD-10-CM | POA: Diagnosis present

## 2023-05-19 DIAGNOSIS — W458XXA Other foreign body or object entering through skin, initial encounter: Secondary | ICD-10-CM | POA: Diagnosis not present

## 2023-05-19 DIAGNOSIS — Z23 Encounter for immunization: Secondary | ICD-10-CM | POA: Diagnosis not present

## 2023-05-19 MED ORDER — LIDOCAINE HCL (PF) 1 % IJ SOLN
10.0000 mL | Freq: Once | INTRAMUSCULAR | Status: AC
  Administered 2023-05-19: 10 mL
  Filled 2023-05-19: qty 10

## 2023-05-19 MED ORDER — CEPHALEXIN 500 MG PO CAPS: 1000.0000 mg | ORAL_CAPSULE | Freq: Two times a day (BID) | ORAL | 0 refills | Status: DC

## 2023-05-19 MED ORDER — TETANUS-DIPHTH-ACELL PERTUSSIS 5-2.5-18.5 LF-MCG/0.5 IM SUSY
0.5000 mL | PREFILLED_SYRINGE | Freq: Once | INTRAMUSCULAR | Status: AC
  Administered 2023-05-19: 0.5 mL via INTRAMUSCULAR
  Filled 2023-05-19: qty 0.5

## 2023-05-19 NOTE — ED Triage Notes (Signed)
Pt to ED via POV for fish hook in his hand

## 2023-05-19 NOTE — ED Provider Notes (Signed)
[facility] Provider Note  Patient Contact: 10:36 PM (approximate)   History   Foreign Body in Skin   HPI  William Curtis is a 37 y.o. male who presents the emergency department complaining of a fishhook in his left thumb.  Patient was fishing when he went to clean out his boat.  Patient accidentally stuck his finger with a try hook barb to the left thumb.  Patient with full range of motion preserved.  Unsure last tetanus shot.  No other complaints at this time.     Physical Exam   Triage Vital Signs: ED Triage Vitals  Encounter Vitals Group     BP 05/19/23 1732 134/86     Systolic BP Percentile --      Diastolic BP Percentile --      Pulse Rate 05/19/23 1732 66     Resp 05/19/23 1732 18     Temp 05/19/23 1732 99.6 F (37.6 C)     Temp src --      SpO2 05/19/23 1732 95 %     Weight 05/19/23 1731 240 lb (108.9 kg)     Height 05/19/23 1731 5\' 10"  (1.778 m)     Head Circumference --      Peak Flow --      Pain Score 05/19/23 2134 3     Pain Loc --      Pain Education --      Exclude from Growth Chart --     Most recent vital signs: Vitals:   05/19/23 2134 05/19/23 2214  BP: (!) 125/90 128/82  Pulse: 61 65  Resp: 18 18  Temp: 98.9 F (37.2 C)   SpO2: 95% 99%     General: Alert and in no acute distress.  Cardiovascular:  Good peripheral perfusion Respiratory: Normal respiratory effort without tachypnea or retractions. Lungs CTAB.  Musculoskeletal: Full range of motion to all extremities.  Visualization of the left thumb reveals foreign body with fishhook embedded in the proximal phalanx of the the digit. Neurologic:  No gross focal neurologic deficits are appreciated.  Skin:   No rash noted Other:   ED Results / Procedures / Treatments   Labs (all labs ordered are listed, but only abnormal results are displayed) Labs Reviewed - No data to display   EKG     RADIOLOGY    No results found.  PROCEDURES:  Critical Care  performed: No  .Foreign Body Removal  Date/Time: 05/19/2023 10:38 PM  Performed by: Racheal Patches, PA-C Authorized by: Racheal Patches, PA-C  Consent: Verbal consent obtained. Risks and benefits: risks, benefits and alternatives were discussed Consent given by: patient Patient understanding: patient states understanding of the procedure being performed Imaging studies: imaging studies available Required items: required blood products, implants, devices, and special equipment available Patient identity confirmed: verbally with patient Time out: Immediately prior to procedure a "time out" was called to verify the correct patient, procedure, equipment, support staff and site/side marked as required. Body area: skin General location: upper extremity Location details: left thumb Anesthesia: digital block  Anesthesia: Local Anesthetic: lidocaine 1% without epinephrine Anesthetic total: 10 mL  Sedation: Patient sedated: no  Patient restrained: no Patient cooperative: yes Localization method: visualized Removal mechanism: hemostat and forceps (Electric ring cutter) Tendon involvement: none Depth: deep Complexity: simple 1 objects recovered. Objects recovered: Fishhook Post-procedure assessment: foreign body removed Patient tolerance: patient tolerated the procedure well with no immediate complications Comments: Patient had fishhook embedded in the  left thumb.  Thumb was blocked using a digital block.  Using forceps, hook was passed through the skin into the barb was exposed, barb was cut off with an electric ring cutter and then retracted back to the skin and fully removed.  Full range of motion both prior to and status post procedure.  Area was cleansed thoroughly, dressed prior to discharge.     MEDICATIONS ORDERED IN ED: Medications  Tdap (BOOSTRIX) injection 0.5 mL (0.5 mLs Intramuscular Given 05/19/23 2130)  lidocaine (PF) (XYLOCAINE) 1 % injection 10 mL (10 mLs  Infiltration Given by Other 05/19/23 2105)  lidocaine (PF) (XYLOCAINE) 1 % injection 10 mL (10 mLs Infiltration Given by Other 05/19/23 2110)     IMPRESSION / MDM / ASSESSMENT AND PLAN / ED COURSE  I reviewed the triage vital signs and the nursing notes.                                 Differential diagnosis includes, but is not limited to, foreign body of the finger, tendon injury, finger infection   Patient's presentation is most consistent with acute presentation with potential threat to life or bodily function.   Patient's diagnosis is consistent with foreign body of the left thumb.  Patient presents emergency department with an embedded fishhook in the left thumb.  This is extracted as described above.  Patient tolerated well with no complications.  Antibiotics prophylactically.  Tetanus shot updated at this time.  Follow-up primary care as needed..  Patient is given ED precautions to return to the ED for any worsening or new symptoms.     FINAL CLINICAL IMPRESSION(S) / ED DIAGNOSES   Final diagnoses:  Fish hook injury of finger of left hand, initial encounter     Rx / DC Orders   ED Discharge Orders          Ordered    cephALEXin (KEFLEX) 500 MG capsule  2 times daily        05/19/23 2210             Note:  This document was prepared using Dragon voice recognition software and may include unintentional dictation errors.   Lanette Hampshire 05/19/23 2347    Sharman Cheek, MD 05/22/23 1534

## 2023-10-23 ENCOUNTER — Other Ambulatory Visit: Payer: Self-pay

## 2023-10-23 ENCOUNTER — Inpatient Hospital Stay
Admission: EM | Admit: 2023-10-23 | Discharge: 2023-10-27 | DRG: 872 | Disposition: A | Discharge status: Home / Self Care | Payer: 59 | Attending: Internal Medicine | Admitting: Internal Medicine

## 2023-10-23 ENCOUNTER — Emergency Department: Payer: 59

## 2023-10-23 DIAGNOSIS — R109 Unspecified abdominal pain: Secondary | ICD-10-CM | POA: Insufficient documentation

## 2023-10-23 DIAGNOSIS — Z6838 Body mass index (BMI) 38.0-38.9, adult: Secondary | ICD-10-CM

## 2023-10-23 DIAGNOSIS — K219 Gastro-esophageal reflux disease without esophagitis: Secondary | ICD-10-CM | POA: Diagnosis present

## 2023-10-23 DIAGNOSIS — Z79899 Other long term (current) drug therapy: Secondary | ICD-10-CM

## 2023-10-23 DIAGNOSIS — A419 Sepsis, unspecified organism: Principal | ICD-10-CM | POA: Diagnosis present

## 2023-10-23 DIAGNOSIS — F429 Obsessive-compulsive disorder, unspecified: Secondary | ICD-10-CM | POA: Diagnosis present

## 2023-10-23 DIAGNOSIS — F4310 Post-traumatic stress disorder, unspecified: Secondary | ICD-10-CM | POA: Diagnosis present

## 2023-10-23 DIAGNOSIS — R6510 Systemic inflammatory response syndrome (SIRS) of non-infectious origin without acute organ dysfunction: Secondary | ICD-10-CM

## 2023-10-23 DIAGNOSIS — D72829 Elevated white blood cell count, unspecified: Secondary | ICD-10-CM | POA: Insufficient documentation

## 2023-10-23 DIAGNOSIS — K5720 Diverticulitis of large intestine with perforation and abscess without bleeding: Principal | ICD-10-CM | POA: Diagnosis present

## 2023-10-23 DIAGNOSIS — Z818 Family history of other mental and behavioral disorders: Secondary | ICD-10-CM

## 2023-10-23 DIAGNOSIS — K5732 Diverticulitis of large intestine without perforation or abscess without bleeding: Secondary | ICD-10-CM

## 2023-10-23 DIAGNOSIS — F410 Panic disorder [episodic paroxysmal anxiety] without agoraphobia: Secondary | ICD-10-CM | POA: Diagnosis present

## 2023-10-23 DIAGNOSIS — Z7982 Long term (current) use of aspirin: Secondary | ICD-10-CM

## 2023-10-23 DIAGNOSIS — Z885 Allergy status to narcotic agent status: Secondary | ICD-10-CM

## 2023-10-23 DIAGNOSIS — F419 Anxiety disorder, unspecified: Secondary | ICD-10-CM | POA: Diagnosis present

## 2023-10-23 DIAGNOSIS — Z823 Family history of stroke: Secondary | ICD-10-CM

## 2023-10-23 DIAGNOSIS — Z881 Allergy status to other antibiotic agents status: Secondary | ICD-10-CM

## 2023-10-23 DIAGNOSIS — Z87820 Personal history of traumatic brain injury: Secondary | ICD-10-CM

## 2023-10-23 DIAGNOSIS — K5792 Diverticulitis of intestine, part unspecified, without perforation or abscess without bleeding: Secondary | ICD-10-CM | POA: Insufficient documentation

## 2023-10-23 HISTORY — DX: Diverticulitis of intestine, part unspecified, without perforation or abscess without bleeding: K57.92

## 2023-10-23 LAB — CBC
HCT: 43.1 % (ref 39.0–52.0)
Hemoglobin: 15.1 g/dL (ref 13.0–17.0)
MCH: 32.1 pg (ref 26.0–34.0)
MCHC: 35 g/dL (ref 30.0–36.0)
MCV: 91.7 fL (ref 80.0–100.0)
Platelets: 241 10*3/uL (ref 150–400)
RBC: 4.7 MIL/uL (ref 4.22–5.81)
RDW: 12.8 % (ref 11.5–15.5)
WBC: 13.7 10*3/uL — ABNORMAL HIGH (ref 4.0–10.5)
nRBC: 0 % (ref 0.0–0.2)

## 2023-10-23 LAB — RESP PANEL BY RT-PCR (RSV, FLU A&B, COVID)  RVPGX2
Influenza A by PCR: NEGATIVE
Influenza B by PCR: NEGATIVE
Resp Syncytial Virus by PCR: NEGATIVE
SARS Coronavirus 2 by RT PCR: NEGATIVE

## 2023-10-23 LAB — COMPREHENSIVE METABOLIC PANEL
ALT: 31 U/L (ref 0–44)
AST: 19 U/L (ref 15–41)
Albumin: 4.5 g/dL (ref 3.5–5.0)
Alkaline Phosphatase: 50 U/L (ref 38–126)
Anion gap: 13 (ref 5–15)
BUN: 16 mg/dL (ref 6–20)
CO2: 25 mmol/L (ref 22–32)
Calcium: 9.3 mg/dL (ref 8.9–10.3)
Chloride: 99 mmol/L (ref 98–111)
Creatinine, Ser: 1.16 mg/dL (ref 0.61–1.24)
GFR, Estimated: >60 mL/min (ref >60)
Glucose, Bld: 96 mg/dL (ref 70–99)
Potassium: 4 mmol/L (ref 3.5–5.1)
Sodium: 137 mmol/L (ref 135–145)
Total Bilirubin: 1.6 mg/dL — ABNORMAL HIGH (ref 0.0–1.2)
Total Protein: 8 g/dL (ref 6.5–8.1)

## 2023-10-23 LAB — URINALYSIS, ROUTINE W REFLEX MICROSCOPIC
Bacteria, UA: NONE SEEN
Bilirubin Urine: NEGATIVE
Glucose, UA: NEGATIVE mg/dL
Ketones, ur: NEGATIVE mg/dL
Leukocytes,Ua: NEGATIVE
Nitrite: NEGATIVE
Protein, ur: NEGATIVE mg/dL
Specific Gravity, Urine: 1.024 (ref 1.005–1.030)
Squamous Epithelial / HPF: 0 /[HPF] (ref 0–5)
pH: 5 (ref 5.0–8.0)

## 2023-10-23 LAB — LIPASE, BLOOD: Lipase: 26 U/L (ref 11–51)

## 2023-10-23 MED ORDER — ONDANSETRON HCL 4 MG PO TABS: 4.0000 mg | ORAL_TABLET | Freq: Four times a day (QID) | ORAL | Status: DC | PRN

## 2023-10-23 MED ORDER — MORPHINE SULFATE (PF) 4 MG/ML IV SOLN
4.0000 mg | INTRAVENOUS | Status: DC | PRN
  Administered 2023-10-24 (×2): 4 mg via INTRAVENOUS
  Filled 2023-10-23 (×2): qty 1

## 2023-10-23 MED ORDER — METRONIDAZOLE 500 MG/100ML IV SOLN
500.0000 mg | Freq: Two times a day (BID) | INTRAVENOUS | Status: DC
  Administered 2023-10-24 – 2023-10-25 (×3): 500 mg via INTRAVENOUS
  Filled 2023-10-23 (×4): qty 100

## 2023-10-23 MED ORDER — BUTALBITAL-APAP-CAFFEINE 50-325-40 MG PO TABS
1.0000 | ORAL_TABLET | Freq: Four times a day (QID) | ORAL | Status: DC | PRN
  Administered 2023-10-25 – 2023-10-26 (×6): 1 via ORAL
  Filled 2023-10-23 (×6): qty 1

## 2023-10-23 MED ORDER — FENTANYL CITRATE PF 50 MCG/ML IJ SOSY
25.0000 ug | PREFILLED_SYRINGE | INTRAMUSCULAR | Status: DC | PRN
  Administered 2023-10-23 – 2023-10-24 (×5): 25 ug via INTRAVENOUS
  Filled 2023-10-23 (×5): qty 1

## 2023-10-23 MED ORDER — ONDANSETRON HCL 4 MG/2ML IJ SOLN
4.0000 mg | Freq: Once | INTRAMUSCULAR | Status: AC
  Administered 2023-10-23: 4 mg via INTRAVENOUS
  Filled 2023-10-23: qty 2

## 2023-10-23 MED ORDER — SODIUM CHLORIDE 0.9 % IV SOLN
2.0000 g | INTRAVENOUS | Status: DC
  Administered 2023-10-23 – 2023-10-24 (×2): 2 g via INTRAVENOUS
  Filled 2023-10-23 (×3): qty 20

## 2023-10-23 MED ORDER — ACETAMINOPHEN 325 MG PO TABS
650.0000 mg | ORAL_TABLET | Freq: Four times a day (QID) | ORAL | Status: DC | PRN
Start: 2023-10-23 — End: 2023-10-27
  Administered 2023-10-24: 650 mg via ORAL
  Filled 2023-10-23 (×2): qty 2

## 2023-10-23 MED ORDER — LORAZEPAM 2 MG/ML IJ SOLN
0.5000 mg | Freq: Four times a day (QID) | INTRAMUSCULAR | Status: AC | PRN
  Filled 2023-10-23: qty 1

## 2023-10-23 MED ORDER — MORPHINE SULFATE (PF) 4 MG/ML IV SOLN: 4.0000 mg | Freq: Once | INTRAVENOUS | Status: DC

## 2023-10-23 MED ORDER — METRONIDAZOLE 500 MG/100ML IV SOLN
500.0000 mg | Freq: Once | INTRAVENOUS | Status: AC
  Administered 2023-10-23: 500 mg via INTRAVENOUS
  Filled 2023-10-23: qty 100

## 2023-10-23 MED ORDER — SODIUM CHLORIDE 0.9 % IV SOLN: INTRAVENOUS | Status: AC

## 2023-10-23 MED ORDER — IOHEXOL 350 MG/ML SOLN
100.0000 mL | Freq: Once | INTRAVENOUS | Status: AC | PRN
  Administered 2023-10-23: 100 mL via INTRAVENOUS

## 2023-10-23 MED ORDER — SODIUM CHLORIDE 0.9 % IV SOLN: 12.5000 mg | Freq: Four times a day (QID) | INTRAVENOUS | Status: AC | PRN

## 2023-10-23 MED ORDER — SODIUM CHLORIDE 0.9 % IV SOLN
2.0000 g | Freq: Once | INTRAVENOUS | Status: DC
  Filled 2023-10-23: qty 20

## 2023-10-23 MED ORDER — ACETAMINOPHEN 325 MG PO TABS
650.0000 mg | ORAL_TABLET | Freq: Once | ORAL | Status: AC
  Administered 2023-10-23: 650 mg via ORAL
  Filled 2023-10-23: qty 2

## 2023-10-23 MED ORDER — SENNOSIDES-DOCUSATE SODIUM 8.6-50 MG PO TABS
1.0000 | ORAL_TABLET | Freq: Every evening | ORAL | Status: DC | PRN
  Filled 2023-10-23: qty 1

## 2023-10-23 MED ORDER — ACETAMINOPHEN 650 MG RE SUPP: 650.0000 mg | Freq: Four times a day (QID) | RECTAL | Status: DC | PRN

## 2023-10-23 MED ORDER — ONDANSETRON HCL 4 MG/2ML IJ SOLN
4.0000 mg | Freq: Four times a day (QID) | INTRAMUSCULAR | Status: DC | PRN
Start: 2023-10-23 — End: 2023-10-27
  Administered 2023-10-24 – 2023-10-25 (×4): 4 mg via INTRAVENOUS
  Filled 2023-10-23 (×4): qty 2

## 2023-10-23 MED ORDER — MORPHINE SULFATE (PF) 4 MG/ML IV SOLN
4.0000 mg | Freq: Once | INTRAVENOUS | Status: AC
  Administered 2023-10-23: 4 mg via INTRAVENOUS
  Filled 2023-10-23: qty 1

## 2023-10-23 MED ORDER — HYDROMORPHONE HCL 1 MG/ML IJ SOLN
0.5000 mg | INTRAMUSCULAR | Status: DC | PRN
  Administered 2023-10-23 – 2023-10-24 (×4): 0.5 mg via INTRAVENOUS
  Filled 2023-10-23 (×5): qty 0.5

## 2023-10-23 MED ORDER — DIPHENHYDRAMINE HCL 50 MG/ML IJ SOLN
12.5000 mg | Freq: Three times a day (TID) | INTRAMUSCULAR | Status: AC | PRN
  Administered 2023-10-24 (×2): 12.5 mg via INTRAVENOUS
  Filled 2023-10-23 (×2): qty 1

## 2023-10-23 MED ORDER — SODIUM CHLORIDE 0.9 % IV BOLUS
500.0000 mL | Freq: Once | INTRAVENOUS | Status: AC
  Administered 2023-10-23: 500 mL via INTRAVENOUS

## 2023-10-23 NOTE — Assessment & Plan Note (Signed)
 Ceftriaxone  2 g IV daily, metronidazole  500 mg IV twice daily, 7 days of coverage ordered S/P sNS 500 mL bolus per EDP NS infusion at 125 mL/h, 1 day ordered on admission Symptomatic support: Acetaminophen  650 mg p.o./rectal every 6 hours as needed for mild pain, fever; morphine  4 mg IV every 4 hours as needed for moderate pain, 1 day coverage ordered; fentanyl  25 mcg IV every 4 hours as needed for severe pain, 20 hours of coverage ordered AM team to reevaluate patient at bedside for continued IV opioid pain requirements

## 2023-10-23 NOTE — Assessment & Plan Note (Signed)
Ativan 0.5 mg IV every 6 hours as needed for anxiety, 16 hours of coverage ordered

## 2023-10-23 NOTE — H&P (Addendum)
 History and Physical   AVANTAE BITHER FMW:[id number] DOB: 1986/04/14 DOA: 10/23/2023  PCP: Alla Amis, MD  Patient coming from: Home  I have personally briefly reviewed patient's old medical records in [facility] EMR.  Chief Concern: Abdominal pain, fever, chills  HPI: Mr. William Curtis is a 38 year old male with history of diverticulosis with history of diverticulitis, obsessive-compulsive disorder, PTSD, with history of rhabdomyolysis, acute kidney injury, who presents emergency department for chief concerns of abdominal pain, fever, chills.  Vitals in the ED showed temperature of 100.7, respiration rate of 20, heart rate 90, blood pressure 130/72, SpO2 of 96% on room air.  Serum sodium is 137, potassium 4.0, chloride 99, bicarb 25, BUN of 16, serum creatinine 1.16, EGFR greater than 60, nonfasting blood glucose 96, WBC 13.7, hemoglobin 15.1, platelets of 241.  ED treatment: Acetaminophen  650 mg p.o. one-time dose, morphine  4 mg IV 2 doses were ordered, Flagyl  500 mg IV, ceftriaxone  2 g IV one-time dose, sodium chloride  500 mL liter bolus.  EDP consulted general surgery, Dr. Jordis who states he will consult on the patient tomorrow and no indications for surgery right now. -------------------------------------- At bedside, patient he is able to tell me his name, age, current calendar year, current location.  He reports that he developed lower abdominal pain that started on Sunday. He and his wife Door D[employer] from [employer] [facility] hot chicken wings).   He continued to have abdominal pain on Monday at work while working at site in [employer].  He developed fever of 104 Monday evening. He denies trauma to his person.  He denies nausea, vomiting, diarrhea. He denies chest pain. He endorses shortness of breath that is situational, especially with pain.   Social history: He lives at home with his wife. He denies tobacco use, recreational drug use. He drinks etoh on the  weekends. If he is stressed at work, he 2-3 bottles of 12 oz beers after work and the same amount on the weekend. He had one mix drink consisting of vodka on Saturday.  Patient currently works in holiday representative.  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  ROS: Constitutional: no weight change, + fever ENT/Mouth: no sore throat, no rhinorrhea Eyes: no eye pain, no vision changes Cardiovascular: no chest pain, no dyspnea,  no edema, no palpitations Respiratory: no cough, no sputum, no wheezing Gastrointestinal: no nausea, no vomiting, no diarrhea, no constipation, + abdominal pain Genitourinary: no urinary incontinence, no dysuria, no hematuria Musculoskeletal: no arthralgias, no myalgias Skin: no skin lesions, no pruritus, Neuro: no weakness, no loss of consciousness, no syncope Psych: no anxiety, no depression, + decrease appetite Heme/Lymph: no bruising, no bleeding  ED Course: Discussed with the EDP, patient requiring hospitalization for chief concerns of diverticulitis with microperforation.  Assessment/Plan  Principal Problem:   Diverticulitis Active Problems:   SIRS (systemic inflammatory response syndrome) (HCC)   PTSD (post-traumatic stress disorder)   OCD (obsessive compulsive disorder)   Anxiety attack   Leukocytosis   Abdominal pain   Morbid obesity due to excess calories (HCC)   Assessment and Plan:  * Diverticulitis Ceftriaxone  2 g IV daily, metronidazole  500 mg IV twice daily, 7 days of coverage ordered S/P sNS 500 mL bolus per EDP NS infusion at 125 mL/h, 1 day ordered on admission Symptomatic support: Acetaminophen  650 mg p.o./rectal every 6 hours as needed for mild pain, fever; morphine  4 mg IV every 4 hours as needed for moderate pain, 1 day coverage ordered; fentanyl  25  mcg IV every 4 hours as needed for severe pain, 20 hours of coverage ordered AM team to reevaluate patient at bedside for continued IV opioid pain requirements  SIRS (systemic  inflammatory response syndrome) (HCC) Secondary to infectious etiology, increase temperature and leukocytosis No organ involvement Blood cultures x 2 ordered on admission Continue with metronidazole  500 mg IV twice daily and ceftriaxone  2 g IV daily  Morbid obesity due to excess calories (HCC) This complicates overall care and prognosis.  Abdominal pain Secondary to diverticulitis, treatment per above  Leukocytosis Secondary to diverticulitis, treat per above Repeat CBC in the a.m.  Anxiety attack Ativan  0.5 mg IV every 6 hours as needed for anxiety, 16 hours of coverage ordered  OCD (obsessive compulsive disorder) Patient is currently in the process of weaning off of 100 mg of sertraline and buspirone 30 mg daily as needed Patient has also been prescribed bupropion 150 mg daily and this has not been started yet Above medications will not be resumed on admission Will avoid buspirone and bupropion together as this lower threshold for seizure  Chart reviewed.   DVT prophylaxis: TED hose; AM team to initiate pharmacologic DVT prophylaxis when the benefits outweigh the risk Code Status: Full code Diet: NPO except for sips of meds and ice chips Family Communication: Discussed and updated with spouse at bedside with patient's permission Disposition Plan: Pending clinical course Consults called: General Surgery Admission status: telemetry surgical, observation  Past Medical History:  Diagnosis Date   AKI (acute kidney injury) (HCC) 2011   Anxiety    Depression    Diverticulitis    GERD (gastroesophageal reflux disease)    OCC   Rhabdomyolysis    Rhabdomyolysis 2011   Sleep apnea    TBI (traumatic brain injury) [facility])    Past Surgical History:  Procedure Laterality Date   CARPAL TUNNEL RELEASE Right 04/28/2020   Procedure: CARPAL TUNNEL RELEASE ENDOSCOPIC;  Surgeon: Edie Norleen PARAS, MD;  Location: [facility] ORS;  Service: Orthopedics;  Laterality: Right;   CARPAL TUNNEL RELEASE  Left 01/12/2021   Procedure: CARPAL TUNNEL RELEASE ENDOSCOPIC;  Surgeon: Edie Norleen PARAS, MD;  Location: [facility] ORS;  Service: Orthopedics;  Laterality: Left;   COLONOSCOPY     Social History:  reports that he has never smoked. He has never used smokeless tobacco. He reports that he does not currently use alcohol. He reports that he does not use drugs.  Allergies  Allergen Reactions   Zolmitriptan Other (See Comments)    Chest tightness and numbness in side   Ciprofloxacin Rash   Oxycodone-Acetaminophen  Rash   Family History  Problem Relation Age of Onset   Anxiety disorder Mother    Stroke Father    Anxiety disorder Brother    Family history: Family history reviewed and not pertinent.  Prior to Admission medications   Medication Sig Start Date End Date Taking? Authorizing Provider  Aspirin-Acetaminophen -Caffeine  (GOODYS EXTRA STRENGTH) 500-325-65 MG PACK Take 1 packet by mouth daily as needed (headache or pain).    [provider]  busPIRone (BUSPAR) 30 MG tablet Take 30 mg by mouth daily as needed (anxiety).    [provider]  cephALEXin  (KEFLEX ) 500 MG capsule Take 2 capsules (1,000 mg total) by mouth 2 (two) times daily. 05/19/23   Cuthriell, Dorn BIRCH, PA-C  HYDROcodone -acetaminophen  (NORCO) 5-325 MG tablet Take 1-2 tablets by mouth every 6 (six) hours as needed for moderate pain or severe pain. MAXIMUM TOTAL ACETAMINOPHEN  DOSE IS 4000 MG PER DAY 01/12/21  Poggi, Norleen PARAS, MD  NON FORMULARY Pt uses a-pap over night    [provider]  PARoxetine (PAXIL) 30 MG tablet Take 30 mg by mouth at bedtime.  02/14/17   [provider]   Physical Exam: Vitals:   10/23/23 1215 10/23/23 1657 10/23/23 2100  BP: (!) 142/88 130/72   Pulse: 93 90   Resp: 20 20   Temp: 99.7 F (37.6 C) (!) 100.7 F (38.2 C) 98.4 F (36.9 C)  TempSrc: Oral Oral Oral  SpO2: 94% 96%   Weight: 118.4 kg    Height: 5' 10 (1.778 m)     Constitutional: appears age-appropriate,  calm Eyes: PERRL, lids and conjunctivae normal ENMT: Mucous membranes are moist. Posterior pharynx clear of any exudate or lesions. Age-appropriate dentition. Hearing appropriate Neck: normal, supple, no masses, no thyromegaly Respiratory: clear to auscultation bilaterally, no wheezing, no crackles. Normal respiratory effort. No accessory muscle use.  Cardiovascular: Regular rate and rhythm, no murmurs / rubs / gallops. No extremity edema. 2+ pedal pulses. No carotid bruits.  Abdomen: Obese abdomen, + tenderness in bilateral lower abdomen, no masses palpated, no hepatosplenomegaly. Bowel sounds positive.  Musculoskeletal: no clubbing / cyanosis. No joint deformity upper and lower extremities. Good ROM, no contractures, no atrophy. Normal muscle tone.  Skin: no rashes, lesions, ulcers. No induration Neurologic: Sensation intact. Strength 5/5 in all 4.  Psychiatric: Normal judgment and insight. Alert and oriented x 3. Normal mood.   EKG: Not indicated at this time  Chest x-ray on Admission: not indicated at this time  CT ABDOMEN PELVIS W CONTRAST Result Date: 10/23/2023 CLINICAL DATA:  Right lower quadrant pain, chills EXAM: CT ABDOMEN AND PELVIS WITH CONTRAST TECHNIQUE: Multidetector CT imaging of the abdomen and pelvis was performed using the standard protocol following bolus administration of intravenous contrast. RADIATION DOSE REDUCTION: This exam was performed according to the departmental dose-optimization program which includes automated exposure control, adjustment of the mA and/or kV according to patient size and/or use of iterative reconstruction technique. CONTRAST:  OMNIPAQUE  IOHEXOL  350 MG/ML SOLN COMPARISON:  03/31/2019 FINDINGS: Lower chest: No pleural or pericardial effusion. Minimal dependent atelectasis posteriorly in the lung bases. Hepatobiliary: No focal liver abnormality is seen. No gallstones, gallbladder wall thickening, or biliary dilatation. Pancreas: Unremarkable. No  pancreatic ductal dilatation or surrounding inflammatory changes. Spleen: Normal in size without focal abnormality. Adrenals/Urinary Tract: Adrenal glands are unremarkable. Kidneys are normal, without renal calculi, focal lesion, or hydronephrosis. Bladder is unremarkable. Stomach/Bowel: Stomach nondistended, unremarkable. Small bowel decompressed. Normal appendix. The colon is partially distended. Multiple distal descending and proximal sigmoid diverticula. Cluster of extraluminal gas bubbles in the mesentery of the proximal sigmoid colon in the left lower quadrant, with regional inflammatory changes. No abscess. Vascular/Lymphatic: Normal abdominal aorta. Circumaortic left renal vein, anatomic variant. Portal vein patent. No abdominal or pelvic adenopathy. Reproductive: Prostate is unremarkable. Other: No ascites.  No free air. Musculoskeletal: No acute or significant osseous findings. IMPRESSION: Acute sigmoid diverticulitis with  microperforation.  No abscess. Electronically Signed   By: JONETTA Faes M.D.   On: 10/23/2023 18:03   Labs on Admission: I have personally reviewed following labs  CBC: Recent Labs  Lab 10/23/23 1220  WBC 13.7*  HGB 15.1  HCT 43.1  MCV 91.7  PLT 241   Basic Metabolic Panel: Recent Labs  Lab 10/23/23 1220  NA 137  K 4.0  CL 99  CO2 25  GLUCOSE 96  BUN 16  CREATININE 1.16  CALCIUM 9.3  GFR: Estimated Creatinine Clearance: 111.4 mL/min (by C-G formula based on SCr of 1.16 mg/dL).  Liver Function Tests: Recent Labs  Lab 10/23/23 1220  AST 19  ALT 31  ALKPHOS 50  BILITOT 1.6*  PROT 8.0  ALBUMIN 4.5   Recent Labs  Lab 10/23/23 1220  LIPASE 26   Urine analysis:    Component Value Date/Time   COLORURINE YELLOW (A) 10/23/2023 1218   APPEARANCEUR CLEAR (A) 10/23/2023 1218   APPEARANCEUR Clear 07/12/2017 0940   LABSPEC 1.024 10/23/2023 1218   PHURINE 5.0 10/23/2023 1218   GLUCOSEU NEGATIVE 10/23/2023 1218   HGBUR SMALL (A) 10/23/2023 1218    BILIRUBINUR NEGATIVE 10/23/2023 1218   BILIRUBINUR Negative 07/12/2017 0940   KETONESUR NEGATIVE 10/23/2023 1218   PROTEINUR NEGATIVE 10/23/2023 1218   NITRITE NEGATIVE 10/23/2023 1218   LEUKOCYTESUR NEGATIVE 10/23/2023 1218   This document was prepared using Dragon Voice Recognition software and may include unintentional dictation errors.  Dr. Sherre [facility]  If 7PM-7AM, please contact overnight-coverage provider If 7AM-7PM, please contact day attending provider [email or web address]  10/23/2023, 9:17 PM

## 2023-10-23 NOTE — ED Notes (Signed)
Provider at bedside for eval.

## 2023-10-23 NOTE — Assessment & Plan Note (Addendum)
 -  This complicates overall care and prognosis.

## 2023-10-23 NOTE — ED Provider Notes (Signed)
 [facility] Provider Note    Event Date/Time   First MD Initiated Contact with Patient 10/23/23 1649     (approximate)   History   Abdominal Pain and Fever   HPI  William Curtis is a 38 y.o. male with history of TBI, rhabdo, AKI, and diverticulitis presents emergency department with abdominal pain, fever and chills.  Sharp pain in the right lower quadrant radiates to his back.  Started at the umbilicus.  States his temp was 104 yesterday.  No diarrhea or vomiting.  His AKI and rhabdo occurred in 2010.      Physical Exam   Triage Vital Signs: ED Triage Vitals [10/23/23 1215]  Encounter Vitals Group     BP (!) 142/88     Systolic BP Percentile      Diastolic BP Percentile      Pulse Rate 93     Resp 20     Temp 99.7 F (37.6 C)     Temp Source Oral     SpO2 94 %     Weight 261 lb (118.4 kg)     Height 5' 10 (1.778 m)     Head Circumference      Peak Flow      Pain Score 7     Pain Loc      Pain Education      Exclude from Growth Chart     Most recent vital signs: Vitals:   10/23/23 1215 10/23/23 1657  BP: (!) 142/88 130/72  Pulse: 93 90  Resp: 20 20  Temp: 99.7 F (37.6 C) (!) 100.7 F (38.2 C)  SpO2: 94% 96%     General: Awake, no distress.   CV:  Good peripheral perfusion. regular rate and  rhythm Resp:  Normal effort. Lungs CTA Abd:  No distention.  Tender in the right lower quadrant, minimal tenderness right upper quadrant, left lower quadrant minimally tender, no CVA tenderness Other:      ED Results / Procedures / Treatments   Labs (all labs ordered are listed, but only abnormal results are displayed) Labs Reviewed  COMPREHENSIVE METABOLIC PANEL - Abnormal; Notable for the following components:      Result Value   Total Bilirubin 1.6 (*)    All other components within normal limits  CBC - Abnormal; Notable for the following components:   WBC 13.7 (*)    All other components within normal limits  URINALYSIS,  ROUTINE W REFLEX MICROSCOPIC - Abnormal; Notable for the following components:   Color, Urine YELLOW (*)    APPearance CLEAR (*)    Hgb urine dipstick SMALL (*)    All other components within normal limits  RESP PANEL BY RT-PCR (RSV, FLU A&B, COVID)  [id number]  CULTURE, BLOOD (ROUTINE X 2)  CULTURE, BLOOD (ROUTINE X 2)  LIPASE, BLOOD  BASIC METABOLIC PANEL  CBC     EKG     RADIOLOGY CT abdomen pelvis IV contrast    PROCEDURES:   Procedures Chief Complaint  Patient presents with   Abdominal Pain   Fever      MEDICATIONS ORDERED IN ED: Medications  acetaminophen  (TYLENOL ) tablet 650 mg (has no administration in time range)    Or  acetaminophen  (TYLENOL ) suppository 650 mg (has no administration in time range)  ondansetron  (ZOFRAN ) tablet 4 mg (has no administration in time range)    Or  ondansetron  (ZOFRAN ) injection 4 mg (has no administration in time range)  senna-docusate (Senokot-S) tablet  1 tablet (has no administration in time range)  promethazine (PHENERGAN) 12.5 mg in sodium chloride  0.9 % 50 mL IVPB (has no administration in time range)  morphine  (PF) 4 MG/ML injection 4 mg (has no administration in time range)  fentaNYL  (SUBLIMAZE ) injection 25 mcg (has no administration in time range)  cefTRIAXone  (ROCEPHIN ) 2 g in sodium chloride  0.9 % 100 mL IVPB (has no administration in time range)  metroNIDAZOLE  (FLAGYL ) IVPB 500 mg (has no administration in time range)  0.9 %  sodium chloride  infusion (has no administration in time range)  LORazepam  (ATIVAN ) injection 0.5 mg (has no administration in time range)  sodium chloride  0.9 % bolus 500 mL (0 mLs Intravenous Stopped 10/23/23 1830)  acetaminophen  (TYLENOL ) tablet 650 mg (650 mg Oral Given 10/23/23 1719)  ondansetron  (ZOFRAN ) injection 4 mg (4 mg Intravenous Given 10/23/23 1724)  morphine  (PF) 4 MG/ML injection 4 mg (4 mg Intravenous Given 10/23/23 1725)  iohexol  (OMNIPAQUE ) 350 MG/ML injection 100 mL (100 mLs  Intravenous Contrast Given 10/23/23 1753)  metroNIDAZOLE  (FLAGYL ) IVPB 500 mg (0 mg Intravenous Stopped 10/23/23 1924)     IMPRESSION / MDM / ASSESSMENT AND PLAN / ED COURSE  I reviewed the triage vital signs and the nursing notes.                              Differential diagnosis includes, but is not limited to, acute appendicitis, diverticulitis, bowel obstruction, duodenal ulcer 1 acute cholecystitis, COVID, influenza, UTI, kidney stone  Patient's presentation is most consistent with acute illness / injury with system symptoms.   Labs are reassuring except for WBCs elevated at 13.7, respiratory panel is pending  Due to the right lower quadrant pain, CT abdomen pelvis IV contrast ordered.  Saline lock was inserted, 500 mL normal saline, morphine  4 mg IV, Zofran  4 mg IV, and Tylenol  650 mg p.o. for fever.  Respiratory panel reassuring  CT abdomen pelvis with IV contrast shows sigmoid diverticulitis with microperforations, this was independently reviewed interpreted by me after reading radiologist report and looking at images  Consult surgery, states inoculations are small, he should do well n.p.o. and with antibiotics.  Will consult on the floor.  Please admit to the hospitalist  Consult to hospitalist for admission   Spoke with Dr. Sherre, she will be admitting the patient.  Patient is in stable condition at this time.   FINAL CLINICAL IMPRESSION(S) / ED DIAGNOSES   Final diagnoses:  Perforation of sigmoid colon due to diverticulitis     Rx / DC Orders   ED Discharge Orders     None        Note:  This document was prepared using Dragon voice recognition software and may include unintentional dictation errors.    Gasper Devere ORN, PA-C 10/23/23 1953    Dorothyann Drivers, MD 10/23/23 2110

## 2023-10-23 NOTE — Assessment & Plan Note (Signed)
Secondary to diverticulitis, treatment per above

## 2023-10-23 NOTE — ED Notes (Signed)
 Blood cultures sent to lab at this time

## 2023-10-23 NOTE — Hospital Course (Addendum)
 Mr. William Curtis is a 38 year old male with history of diverticulosis with history of diverticulitis, obsessive-compulsive disorder, PTSD, with history of rhabdomyolysis, acute kidney injury, who presents emergency department for chief concerns of abdominal pain, fever, chills.  Vitals in the ED showed temperature of 100.7, respiration rate of 20, heart rate 90, blood pressure 130/72, SpO2 of 96% on room air.  Serum sodium is 137, potassium 4.0, chloride 99, bicarb 25, BUN of 16, serum creatinine 1.16, EGFR greater than 60, nonfasting blood glucose 96, WBC 13.7, hemoglobin 15.1, platelets of 241.  ED treatment: Acetaminophen  650 mg p.o. one-time dose, morphine  4 mg IV 2 doses were ordered, Flagyl  500 mg IV, ceftriaxone  2 g IV one-time dose, sodium chloride  500 mL liter bolus.  EDP consulted general surgery, Dr. Jordis who states he will consult on the patient tomorrow and no indications for surgery right now.

## 2023-10-23 NOTE — ED Notes (Signed)
 Pt ambulatory to room . Pt hooked up to cardiac monitor. CCMD called and pt placed on monitor. Pt wife at bedside. Pt resting comfortably at this time. Call light within reach. Pt asking for pain meds. This EDT explained to pt that it was out of this EDT scope to give medicine and writer would let the RN know. No further needs at this time.

## 2023-10-23 NOTE — Assessment & Plan Note (Addendum)
 Patient is currently in the process of weaning off of 100 mg of sertraline and buspirone 30 mg daily as needed Patient has also been prescribed bupropion 150 mg daily and this has not been started yet Above medications will not be resumed on admission Will avoid buspirone and bupropion together as this lower threshold for seizure

## 2023-10-23 NOTE — Assessment & Plan Note (Signed)
Secondary to infectious etiology, increase temperature and leukocytosis No organ involvement Blood cultures x 2 ordered on admission Continue with metronidazole 500 mg IV twice daily and ceftriaxone 2 g IV daily

## 2023-10-23 NOTE — ED Triage Notes (Signed)
 Pt to ED POV for lower abdominal pain and chills since Sunday after eating at [employer]. Pain feels like gas. Is constant and sharp. Pain is better with pressure. Also intermittent R lower back pain.  104 temp yesterday, temp 99.7 oral in trtiage. Last tylenolw as 4am today. No diarrhea or vomiting. No urinary symptoms.  Hx rhabdomyolysis and AKI in 2010. Hx diverticulitis.

## 2023-10-23 NOTE — Assessment & Plan Note (Signed)
Secondary to diverticulitis, treat per above Repeat CBC in the a.m.

## 2023-10-24 ENCOUNTER — Inpatient Hospital Stay: Payer: 59

## 2023-10-24 DIAGNOSIS — A419 Sepsis, unspecified organism: Secondary | ICD-10-CM | POA: Diagnosis present

## 2023-10-24 DIAGNOSIS — K219 Gastro-esophageal reflux disease without esophagitis: Secondary | ICD-10-CM | POA: Diagnosis present

## 2023-10-24 DIAGNOSIS — Z818 Family history of other mental and behavioral disorders: Secondary | ICD-10-CM | POA: Diagnosis not present

## 2023-10-24 DIAGNOSIS — Z823 Family history of stroke: Secondary | ICD-10-CM | POA: Diagnosis not present

## 2023-10-24 DIAGNOSIS — K5720 Diverticulitis of large intestine with perforation and abscess without bleeding: Secondary | ICD-10-CM | POA: Diagnosis present

## 2023-10-24 DIAGNOSIS — F4310 Post-traumatic stress disorder, unspecified: Secondary | ICD-10-CM | POA: Diagnosis present

## 2023-10-24 DIAGNOSIS — Z885 Allergy status to narcotic agent status: Secondary | ICD-10-CM | POA: Diagnosis not present

## 2023-10-24 DIAGNOSIS — F429 Obsessive-compulsive disorder, unspecified: Secondary | ICD-10-CM | POA: Diagnosis present

## 2023-10-24 DIAGNOSIS — K5732 Diverticulitis of large intestine without perforation or abscess without bleeding: Secondary | ICD-10-CM | POA: Diagnosis not present

## 2023-10-24 DIAGNOSIS — Z87820 Personal history of traumatic brain injury: Secondary | ICD-10-CM | POA: Diagnosis not present

## 2023-10-24 DIAGNOSIS — Z7982 Long term (current) use of aspirin: Secondary | ICD-10-CM | POA: Diagnosis not present

## 2023-10-24 DIAGNOSIS — F419 Anxiety disorder, unspecified: Secondary | ICD-10-CM | POA: Diagnosis present

## 2023-10-24 DIAGNOSIS — Z6838 Body mass index (BMI) 38.0-38.9, adult: Secondary | ICD-10-CM | POA: Diagnosis not present

## 2023-10-24 DIAGNOSIS — Z79899 Other long term (current) drug therapy: Secondary | ICD-10-CM | POA: Diagnosis not present

## 2023-10-24 DIAGNOSIS — F410 Panic disorder [episodic paroxysmal anxiety] without agoraphobia: Secondary | ICD-10-CM | POA: Diagnosis not present

## 2023-10-24 DIAGNOSIS — K5792 Diverticulitis of intestine, part unspecified, without perforation or abscess without bleeding: Secondary | ICD-10-CM | POA: Diagnosis present

## 2023-10-24 DIAGNOSIS — Z881 Allergy status to other antibiotic agents status: Secondary | ICD-10-CM | POA: Diagnosis not present

## 2023-10-24 LAB — LACTIC ACID, PLASMA
Lactic Acid, Venous: 0.8 mmol/L (ref 0.5–1.9)
Lactic Acid, Venous: 0.9 mmol/L (ref 0.5–1.9)

## 2023-10-24 LAB — BASIC METABOLIC PANEL
Anion gap: 8 (ref 5–15)
BUN: 17 mg/dL (ref 6–20)
CO2: 24 mmol/L (ref 22–32)
Calcium: 8.6 mg/dL — ABNORMAL LOW (ref 8.9–10.3)
Chloride: 103 mmol/L (ref 98–111)
Creatinine, Ser: 1.02 mg/dL (ref 0.61–1.24)
GFR, Estimated: >60 mL/min (ref >60)
Glucose, Bld: 88 mg/dL (ref 70–99)
Potassium: 3.9 mmol/L (ref 3.5–5.1)
Sodium: 135 mmol/L (ref 135–145)

## 2023-10-24 LAB — CBC
HCT: 38.4 % — ABNORMAL LOW (ref 39.0–52.0)
Hemoglobin: 13.2 g/dL (ref 13.0–17.0)
MCH: 31.6 pg (ref 26.0–34.0)
MCHC: 34.4 g/dL (ref 30.0–36.0)
MCV: 91.9 fL (ref 80.0–100.0)
Platelets: 207 10*3/uL (ref 150–400)
RBC: 4.18 MIL/uL — ABNORMAL LOW (ref 4.22–5.81)
RDW: 12.8 % (ref 11.5–15.5)
WBC: 12.6 10*3/uL — ABNORMAL HIGH (ref 4.0–10.5)
nRBC: 0 % (ref 0.0–0.2)

## 2023-10-24 MED ORDER — MORPHINE SULFATE (PF) 4 MG/ML IV SOLN
4.0000 mg | Freq: Once | INTRAVENOUS | Status: AC
  Administered 2023-10-24: 4 mg via INTRAVENOUS
  Filled 2023-10-24: qty 1

## 2023-10-24 MED ORDER — LORAZEPAM 2 MG/ML IJ SOLN: 0.5000 mg | Freq: Four times a day (QID) | INTRAMUSCULAR | Status: AC | PRN

## 2023-10-24 MED ORDER — MORPHINE SULFATE (PF) 4 MG/ML IV SOLN
4.0000 mg | INTRAVENOUS | Status: AC | PRN
  Administered 2023-10-24: 4 mg via INTRAVENOUS
  Filled 2023-10-24: qty 1

## 2023-10-24 NOTE — ED Notes (Signed)
 Visitor given warm blanket and recliner. Pt given ice chips, OK to give per order.

## 2023-10-24 NOTE — Consult Note (Signed)
 Patient ID: William Curtis, male   DOB: 09/12/1986, 38 y.o.   MRN: [id number]  HPI William Curtis is a 38 y.o. male seen in consultation at request of Ms. Fisher PA-C.  Case discussed with her in detail.  Presented with 3-day history of left lower quadrant abdominal pain.  This started on Sunday.  Pain got progressively worse , starts the pain is intermittent progressing to almost constant in the left lower quadrant he seems to be improved when he is on his left side.  Pain worsening with movement.  Did have decreased appetite.  He did have fevers up to 104.SABRA  Has any nausea vomiting.  No hematochezia or melena Does state that he takes Goody's powder every day for his migraines Also states that he had a similar episode about 15 years ago in [place]  where they perform a colonoscopy and thought he had ulcerative colitis versus diverticulitis. Negative family history of ulcerative colitis or Crohn's. Reports that he had episode of uncomplicated diverticulitis a couple years ago that was treated with antibiotics. He did have a CT scan that I personally reviewed showing evidence of diverticulitis with microperforation.  No overt free air no abscess Serum sodium is 137, potassium 4.0, chloride 99, bicarb 25, BUN of 16, serum creatinine 1.16, EGFR greater than 60, nonfasting blood glucose 96, WBC 13.7, hemoglobin 15.1, platelets of 241   HPI  Past Medical History:  Diagnosis Date   AKI (acute kidney injury) (HCC) 2011   Anxiety    Depression    Diverticulitis    GERD (gastroesophageal reflux disease)    OCC   Rhabdomyolysis    Rhabdomyolysis 2011   Sleep apnea    TBI (traumatic brain injury) [facility])     Past Surgical History:  Procedure Laterality Date   CARPAL TUNNEL RELEASE Right 04/28/2020   Procedure: CARPAL TUNNEL RELEASE ENDOSCOPIC;  Surgeon: Edie Norleen JINNY, MD;  Location: [facility] ORS;  Service: Orthopedics;  Laterality: Right;   CARPAL TUNNEL RELEASE Left 01/12/2021    Procedure: CARPAL TUNNEL RELEASE ENDOSCOPIC;  Surgeon: Edie Norleen JINNY, MD;  Location: [facility] ORS;  Service: Orthopedics;  Laterality: Left;   COLONOSCOPY      Family History  Problem Relation Age of Onset   Anxiety disorder Mother    Stroke Father    Anxiety disorder Brother     Social History Social History   Tobacco Use   Smoking status: Never   Smokeless tobacco: Never  Vaping Use   Vaping status: Never Used  Substance Use Topics   Alcohol use: Not Currently   Drug use: Never    Allergies  Allergen Reactions   Zolmitriptan Other (See Comments)    Chest tightness and numbness in side  Other Reaction(s): Other (See Comments)   Ciprofloxacin Rash   Oxycodone-Acetaminophen  Rash    Current Facility-Administered Medications  Medication Dose Route Frequency Provider Last Rate Last Admin   0.9 %  sodium chloride  infusion   Intravenous Continuous Cox, Amy N, DO 125 mL/hr at 10/24/23 0649 New Bag at 10/24/23 9350   acetaminophen  (TYLENOL ) tablet 650 mg  650 mg Oral Q6H PRN Cox, Amy N, DO       Or   acetaminophen  (TYLENOL ) suppository 650 mg  650 mg Rectal Q6H PRN Cox, Amy N, DO       butalbital -acetaminophen -caffeine  (FIORICET ) 50-325-40 MG per tablet 1 tablet  1 tablet Oral Q6H PRN Cox, Amy N, DO       cefTRIAXone  (ROCEPHIN ) 2  g in sodium chloride  0.9 % 100 mL IVPB  2 g Intravenous Q24H Cox, Amy N, DO   Stopped at 10/23/23 2100   diphenhydrAMINE  (BENADRYL ) injection 12.5 mg  12.5 mg Intravenous Q8H PRN Cox, Amy N, DO   12.5 mg at 10/24/23 0940   fentaNYL  (SUBLIMAZE ) injection 25 mcg  25 mcg Intravenous Q4H PRN Cox, Amy N, DO   25 mcg at 10/24/23 0940   HYDROmorphone  (DILAUDID ) injection 0.5 mg  0.5 mg Intravenous Q4H PRN Cox, Amy N, DO   0.5 mg at 10/24/23 1050   LORazepam  (ATIVAN ) injection 0.5 mg  0.5 mg Intravenous Q6H PRN Cox, Amy N, DO       metroNIDAZOLE  (FLAGYL ) IVPB 500 mg  500 mg Intravenous Q12H Cox, Amy N, DO   Stopped at 10/24/23 9276   morphine  (PF) 4 MG/ML  injection 4 mg  4 mg Intravenous Q4H PRN Cox, Amy N, DO   4 mg at 10/24/23 9160   ondansetron  (ZOFRAN ) tablet 4 mg  4 mg Oral Q6H PRN Cox, Amy N, DO       Or   ondansetron  (ZOFRAN ) injection 4 mg  4 mg Intravenous Q6H PRN Cox, Amy N, DO   4 mg at 10/24/23 9160   promethazine (PHENERGAN) 12.5 mg in sodium chloride  0.9 % 50 mL IVPB  12.5 mg Intravenous Q6H PRN Cox, Amy N, DO       senna-docusate (Senokot-S) tablet 1 tablet  1 tablet Oral QHS PRN Cox, Amy N, DO       Current Outpatient Medications  Medication Sig Dispense Refill   Aspirin-Acetaminophen -Caffeine  (GOODYS EXTRA STRENGTH) 500-325-65 MG PACK Take 1 packet by mouth daily as needed (headache or pain).     busPIRone (BUSPAR) 30 MG tablet Take 30 mg by mouth daily as needed (anxiety).     sertraline (ZOLOFT) 100 MG tablet Take 100 mg by mouth daily.     buPROPion (WELLBUTRIN XL) 150 MG 24 hr tablet Take 150 mg by mouth daily. (Patient not taking: Reported on 10/24/2023)     cephALEXin  (KEFLEX ) 500 MG capsule Take 2 capsules (1,000 mg total) by mouth 2 (two) times daily. (Patient not taking: Reported on 10/24/2023) 28 capsule 0   NON FORMULARY Pt uses a-pap over night       Review of Systems Full ROS  was asked and was negative except for the information on the HPI  Physical Exam Blood pressure 121/77, pulse 74, temperature 98 F (36.7 C), temperature source Oral, resp. rate 16, height 5' 10 (1.778 m), weight 118.4 kg, SpO2 100%. CONSTITUTIONAL: NAD EYES: Pupils are equal, round, Sclera are non-icteric. EARS, NOSE, MOUTH AND THROAT: The oropharynx is clear. The oral mucosa is pink and moist. Hearing is intact to voice. LYMPH NODES:  Lymph nodes in the neck are normal. RESPIRATORY:  Lungs are clear. There is normal respiratory effort, with equal breath sounds bilaterally, and without pathologic use of accessory muscles. CARDIOVASCULAR: Heart is regular without murmurs, gallops, or rubs. GI: The abdomen is  soft, tenderness to palpation  in the left lower quadrant and suprapubic area.  No rebound and no peritonitis and nondistended. There are no palpable masses. There is no hepatosplenomegaly. There are normal bowel sounds  GU: Rectal deferred.   MUSCULOSKELETAL: Normal muscle strength and tone. No cyanosis or edema.   SKIN: Turgor is good and there are no pathologic skin lesions or ulcers. NEUROLOGIC: Motor and sensation is grossly normal. Cranial nerves are grossly intact. PSYCH:  Oriented  to person, place and time. Affect is normal.  Data Reviewed  I have personally reviewed the patient's imaging, laboratory findings and medical records.    Assessment/ Plan 38 year old male with presumed episode of recurrent complicated diverticulitis with microperforation.  Currently not toxic nor peritoneal and does not need surgical intervention.  I agree with antibiotic management and clear liquid diet at the most. He Will likely benefit from repeat CT in 48 hours.  His exam is definitely more prominent than his CT findings. Also the situation with the patient and the wife in detail.  Low chance of requiring surgical intervention in the acute hospital setting.  They understand if he were to deteriorate we may have to repeat the CT scan sooner or may need a potential surgical intervention that may require a potential diverting ostomy.  Obviously the patient and family want to avoid this.  He will likely benefit from further endoscopic workup by GI.  Other potential etiologies within the differential diagnosis will include colitis related to NSAIDs or potential ulcerative colitis or Crohn's disease which is less likely. Will continue to follow him    Laneta Luna, MD [facility] General Surgeon 10/24/2023, 11:15 AM

## 2023-10-24 NOTE — ED Notes (Signed)
 Dr. Butch Cashing informed of my concerns with patient's abdominal pain remaining uncontrolled this shift and spike in temperature.

## 2023-10-24 NOTE — ED Notes (Signed)
 Abdominal pain remains uncontrolled. Patient becomes drowsy but it unable to rest due to severe pain. No n/v. A&O x 4.

## 2023-10-24 NOTE — Progress Notes (Addendum)
 Progress Note   Patient: William Curtis FMW:[id number] DOB: 06/25/1986 DOA: 10/23/2023     0 DOS: the patient was seen and examined on 10/24/2023   Brief hospital course: Mr. William Curtis is a 38 year old male with history of diverticulosis with history of diverticulitis, obsessive-compulsive disorder, PTSD, with history of rhabdomyolysis, acute kidney injury, who presents emergency department for chief concerns of abdominal pain, fever, chills.  Assessment and Plan: * Acute sigmoid Diverticulitis Sepsis with no organ failure Continues to have fever, tachycardia, tachypnea. Will get lactic acid, chest xray, respiratory 20 path panel. Follow blood cultures. Continue ceftriaxone  2 g IV daily, metronidazole  500 mg IV twice daily. Continue IV hydration. Will start clears today. Continue pain control. If worsening pain or clinical picture will repeat CT Abdomen/ pelvis. Surgery team to evaluate him.  Morbid obesity due to excess calories (HCC) BMI 37.45 Complicates overall care and prognosis. Diet, weight reduction counseled.  Anxiety attack OCD (obsessive compulsive disorder) Hold sertraline, Buspirone, bupropion for now. Outpatient follow up with PCP once acute issues resolve.    Out of bed to chair. Incentive spirometry. Nursing supportive care. Fall, aspiration precautions. DVT prophylaxis   Code Status: Full Code  Subjective: Patient is seen and examined today morning. He has abdominal discomfort mostly lower. Low appetite, asks for ice chips. Wife at bedside.  Physical Exam: Vitals:   10/24/23 0330 10/24/23 0608 10/24/23 0610 10/24/23 1007  BP: 109/77  121/77   Pulse: 85  74   Resp:   16   Temp:  98.3 F (36.8 C)  98 F (36.7 C)  TempSrc:  Oral  Oral  SpO2: 98%  100%   Weight:      Height:        General - Young  obese Caucasian male, no apparent distress HEENT - PERRLA, EOMI, atraumatic head, non tender sinuses. Lung - Clear, basal rales, no rhonchi,  wheezes. Heart - S1, S2 heard, no murmurs, rubs, no pedal edema. Abdomen - Soft, lower abdomen tender, obese, bowel sounds good Neuro - Alert, awake and oriented x 3, non focal exam. Skin - Warm and dry.  Data Reviewed:      Latest Ref Rng & Units 10/24/2023    7:10 AM 10/23/2023   12:20 PM 03/19/2019    9:40 AM  CBC  WBC 4.0 - 10.5 K/uL 12.6  13.7  5.7   Hemoglobin 13.0 - 17.0 g/dL 86.7  84.8  84.4   Hematocrit 39.0 - 52.0 % 38.4  43.1  45.2   Platelets 150 - 400 K/uL 207  241  224       Latest Ref Rng & Units 10/24/2023    7:10 AM 10/23/2023   12:20 PM 03/19/2019    9:40 AM  BMP  Glucose 70 - 99 mg/dL 88  96  898   BUN 6 - 20 mg/dL 17  16  24    Creatinine 0.61 - 1.24 mg/dL 8.97  8.83  8.78   Sodium 135 - 145 mmol/L 135  137  140   Potassium 3.5 - 5.1 mmol/L 3.9  4.0  4.3   Chloride 98 - 111 mmol/L 103  99  103   CO2 22 - 32 mmol/L 24  25  27    Calcium 8.9 - 10.3 mg/dL 8.6  9.3  9.6    CT ABDOMEN PELVIS W CONTRAST Result Date: 10/23/2023 CLINICAL DATA:  Right lower quadrant pain, chills EXAM: CT ABDOMEN AND PELVIS WITH CONTRAST TECHNIQUE: Multidetector CT imaging of the  abdomen and pelvis was performed using the standard protocol following bolus administration of intravenous contrast. RADIATION DOSE REDUCTION: This exam was performed according to the departmental dose-optimization program which includes automated exposure control, adjustment of the mA and/or kV according to patient size and/or use of iterative reconstruction technique. CONTRAST:  OMNIPAQUE  IOHEXOL  350 MG/ML SOLN COMPARISON:  03/31/2019 FINDINGS: Lower chest: No pleural or pericardial effusion. Minimal dependent atelectasis posteriorly in the lung bases. Hepatobiliary: No focal liver abnormality is seen. No gallstones, gallbladder wall thickening, or biliary dilatation. Pancreas: Unremarkable. No pancreatic ductal dilatation or surrounding inflammatory changes. Spleen: Normal in size without focal abnormality.  Adrenals/Urinary Tract: Adrenal glands are unremarkable. Kidneys are normal, without renal calculi, focal lesion, or hydronephrosis. Bladder is unremarkable. Stomach/Bowel: Stomach nondistended, unremarkable. Small bowel decompressed. Normal appendix. The colon is partially distended. Multiple distal descending and proximal sigmoid diverticula. Cluster of extraluminal gas bubbles in the mesentery of the proximal sigmoid colon in the left lower quadrant, with regional inflammatory changes. No abscess. Vascular/Lymphatic: Normal abdominal aorta. Circumaortic left renal vein, anatomic variant. Portal vein patent. No abdominal or pelvic adenopathy. Reproductive: Prostate is unremarkable. Other: No ascites.  No free air. Musculoskeletal: No acute or significant osseous findings. IMPRESSION: Acute sigmoid diverticulitis with  microperforation.  No abscess. Electronically Signed   By: JONETTA Faes M.D.   On: 10/23/2023 18:03   Family Communication: Discussed with patient, wife at bedside, they understand and agree. All questions answereed.  Disposition: Status is: changed to Inpatient Remains inpatient appropriate because: Diverticulitis pain control, IV antibiotics, advance diet  Planned Discharge Destination: Home     Time spent: 38 minutes  Author: Concepcion Riser, MD 10/24/2023 12:16 PM Secure chat 7am to 7pm For on call review [email or web address].

## 2023-10-24 NOTE — Sepsis Progress Note (Signed)
 Following for sepsis monitoring ?

## 2023-10-25 DIAGNOSIS — K5720 Diverticulitis of large intestine with perforation and abscess without bleeding: Secondary | ICD-10-CM | POA: Diagnosis not present

## 2023-10-25 DIAGNOSIS — A419 Sepsis, unspecified organism: Secondary | ICD-10-CM | POA: Diagnosis not present

## 2023-10-25 DIAGNOSIS — K5732 Diverticulitis of large intestine without perforation or abscess without bleeding: Secondary | ICD-10-CM | POA: Diagnosis not present

## 2023-10-25 LAB — BASIC METABOLIC PANEL
Anion gap: 6 (ref 5–15)
BUN: 15 mg/dL (ref 6–20)
CO2: 28 mmol/L (ref 22–32)
Calcium: 8.5 mg/dL — ABNORMAL LOW (ref 8.9–10.3)
Chloride: 104 mmol/L (ref 98–111)
Creatinine, Ser: 0.93 mg/dL (ref 0.61–1.24)
GFR, Estimated: >60 mL/min (ref >60)
Glucose, Bld: 95 mg/dL (ref 70–99)
Potassium: 4.1 mmol/L (ref 3.5–5.1)
Sodium: 138 mmol/L (ref 135–145)

## 2023-10-25 LAB — CBC
HCT: 37.7 % — ABNORMAL LOW (ref 39.0–52.0)
Hemoglobin: 13.2 g/dL (ref 13.0–17.0)
MCH: 32.1 pg (ref 26.0–34.0)
MCHC: 35 g/dL (ref 30.0–36.0)
MCV: 91.7 fL (ref 80.0–100.0)
Platelets: 210 10*3/uL (ref 150–400)
RBC: 4.11 MIL/uL — ABNORMAL LOW (ref 4.22–5.81)
RDW: 12.4 % (ref 11.5–15.5)
WBC: 9.2 10*3/uL (ref 4.0–10.5)
nRBC: 0 % (ref 0.0–0.2)

## 2023-10-25 MED ORDER — ENOXAPARIN SODIUM 40 MG/0.4ML IJ SOSY: 40.0000 mg | PREFILLED_SYRINGE | INTRAMUSCULAR | Status: DC

## 2023-10-25 MED ORDER — MORPHINE SULFATE (PF) 4 MG/ML IV SOLN
4.0000 mg | Freq: Once | INTRAVENOUS | Status: AC
  Administered 2023-10-25: 4 mg via INTRAVENOUS
  Filled 2023-10-25: qty 1

## 2023-10-25 MED ORDER — ENOXAPARIN SODIUM 60 MG/0.6ML IJ SOSY
0.5000 mg/kg | PREFILLED_SYRINGE | INTRAMUSCULAR | Status: DC
  Administered 2023-10-25 – 2023-10-26 (×2): 60 mg via SUBCUTANEOUS
  Filled 2023-10-25 (×2): qty 0.6

## 2023-10-25 MED ORDER — LACTATED RINGERS IV SOLN: INTRAVENOUS | Status: AC

## 2023-10-25 MED ORDER — PIPERACILLIN-TAZOBACTAM 3.375 G IVPB
3.3750 g | Freq: Three times a day (TID) | INTRAVENOUS | Status: DC
  Administered 2023-10-25 – 2023-10-26 (×3): 3.375 g via INTRAVENOUS
  Filled 2023-10-25 (×3): qty 50

## 2023-10-25 MED ORDER — MORPHINE SULFATE (PF) 4 MG/ML IV SOLN
4.0000 mg | INTRAVENOUS | Status: DC | PRN
  Administered 2023-10-25 – 2023-10-26 (×5): 4 mg via INTRAVENOUS
  Filled 2023-10-25 (×5): qty 1

## 2023-10-25 NOTE — Progress Notes (Signed)
 PHARMACIST - PHYSICIAN COMMUNICATION  CONCERNING:  Enoxaparin  (Lovenox ) for DVT Prophylaxis    RECOMMENDATION: Patient was prescribed enoxaprin 40mg  q24 hours for VTE prophylaxis.   Filed Weights   10/23/23 1215 10/24/23 1944  Weight: 118.4 kg (261 lb) 121.3 kg (267 lb 6.7 oz)    Body mass index is 38.37 kg/m.  Estimated Creatinine Clearance: 140.6 mL/min (by C-G formula based on SCr of 0.93 mg/dL).   Based on [facility] policy patient is candidate for enoxaparin  0.5mg /kg TBW SQ every 24 hours based on BMI being >30.  DESCRIPTION: Pharmacy has adjusted enoxaparin  dose per [facility] policy.  Patient is now receiving enoxaparin  60 mg every 24 hours    Lum VEAR Mania, PharmD Clinical Pharmacist  10/25/2023 11:01 AM

## 2023-10-25 NOTE — Progress Notes (Signed)
 Progress Note   Patient: William Curtis FMW:[id number] DOB: November 05, 1985 DOA: 10/23/2023     1 DOS: the patient was seen and examined on 10/25/2023   Brief hospital course: Mr. William Curtis is a 38 year old male with history of diverticulosis with history of diverticulitis, obsessive-compulsive disorder, PTSD, with history of rhabdomyolysis, acute kidney injury, who presents emergency department for chief concerns of abdominal pain, fever, chills.  Assessment and Plan: * Acute sigmoid Diverticulitis Sepsis with no organ failure Continues to have fever, Tamx 101.8. Tachycardia, tachypnea improved. Complains of lower abdominal discomfort. Chest x-ray, lactic acid unremarkable. Blood cultures so far negative. Surgery team follow up appreciated, repeat CT a/p tomorrow ordered. Antibiotics changed to Zosyn  q8hr. Continue gentle IV hydration. He is unable to tolerate clears, states appetite poor. Continue pain control with morphine .  Morbid obesity due to excess calories (HCC) BMI 37.45 Complicates overall care and prognosis. Diet, weight reduction counseled.  Anxiety attack OCD (obsessive compulsive disorder) Hold sertraline, Buspirone, bupropion for now. Outpatient follow up with PCP once acute issues resolve.    Out of bed to chair. Incentive spirometry. Nursing supportive care. Fall, aspiration precautions. DVT prophylaxis   Code Status: Full Code  Subjective: Patient is seen and examined today morning. He has lower abdominal discomfort. Feels better today, low appetite, fever improved. Asks if he can go home. Wife at bedside.  Physical Exam: Vitals:   10/24/23 2307 10/25/23 0553 10/25/23 0823 10/25/23 1138  BP: 111/70 118/71 107/70 120/79  Pulse: 74 72 62 67  Resp: 18  17 18   Temp: 98.4 F (36.9 C) 98 F (36.7 C) 97.9 F (36.6 C) 98.1 F (36.7 C)  TempSrc:  Oral    SpO2: 94% 98% 94% 99%  Weight:      Height:        General - Young  obese Caucasian male, no  apparent distress HEENT - PERRLA, EOMI, atraumatic head, non tender sinuses. Lung - Clear, basal rales, no rhonchi, wheezes. Heart - S1, S2 heard, no murmurs, rubs, no pedal edema. Abdomen - Soft, lower abdomen tender, noguarding, bowel sounds good Neuro - Alert, awake and oriented x 3, non focal exam. Skin - Warm and dry.  Data Reviewed:      Latest Ref Rng & Units 10/25/2023    6:20 AM 10/24/2023    7:10 AM 10/23/2023   12:20 PM  CBC  WBC 4.0 - 10.5 K/uL 9.2  12.6  13.7   Hemoglobin 13.0 - 17.0 g/dL 86.7  86.7  84.8   Hematocrit 39.0 - 52.0 % 37.7  38.4  43.1   Platelets 150 - 400 K/uL 210  207  241       Latest Ref Rng & Units 10/25/2023    6:20 AM 10/24/2023    7:10 AM 10/23/2023   12:20 PM  BMP  Glucose 70 - 99 mg/dL 95  88  96   BUN 6 - 20 mg/dL 15  17  16    Creatinine 0.61 - 1.24 mg/dL 9.06  8.97  8.83   Sodium 135 - 145 mmol/L 138  135  137   Potassium 3.5 - 5.1 mmol/L 4.1  3.9  4.0   Chloride 98 - 111 mmol/L 104  103  99   CO2 22 - 32 mmol/L 28  24  25    Calcium 8.9 - 10.3 mg/dL 8.5  8.6  9.3    DG Chest 1 View Result Date: 10/24/2023 CLINICAL DATA:  Fever EXAM: CHEST  1 VIEW COMPARISON:  10/15/2014 FINDINGS: Low lung volumes. No focal consolidation, pleural effusion or pneumothorax IMPRESSION: Low lung volumes. Electronically Signed   By: Luke Bun M.D.   On: 10/24/2023 20:04   CT ABDOMEN PELVIS W CONTRAST Result Date: 10/23/2023 CLINICAL DATA:  Right lower quadrant pain, chills EXAM: CT ABDOMEN AND PELVIS WITH CONTRAST TECHNIQUE: Multidetector CT imaging of the abdomen and pelvis was performed using the standard protocol following bolus administration of intravenous contrast. RADIATION DOSE REDUCTION: This exam was performed according to the departmental dose-optimization program which includes automated exposure control, adjustment of the mA and/or kV according to patient size and/or use of iterative reconstruction technique. CONTRAST:  OMNIPAQUE  IOHEXOL  350 MG/ML  SOLN COMPARISON:  03/31/2019 FINDINGS: Lower chest: No pleural or pericardial effusion. Minimal dependent atelectasis posteriorly in the lung bases. Hepatobiliary: No focal liver abnormality is seen. No gallstones, gallbladder wall thickening, or biliary dilatation. Pancreas: Unremarkable. No pancreatic ductal dilatation or surrounding inflammatory changes. Spleen: Normal in size without focal abnormality. Adrenals/Urinary Tract: Adrenal glands are unremarkable. Kidneys are normal, without renal calculi, focal lesion, or hydronephrosis. Bladder is unremarkable. Stomach/Bowel: Stomach nondistended, unremarkable. Small bowel decompressed. Normal appendix. The colon is partially distended. Multiple distal descending and proximal sigmoid diverticula. Cluster of extraluminal gas bubbles in the mesentery of the proximal sigmoid colon in the left lower quadrant, with regional inflammatory changes. No abscess. Vascular/Lymphatic: Normal abdominal aorta. Circumaortic left renal vein, anatomic variant. Portal vein patent. No abdominal or pelvic adenopathy. Reproductive: Prostate is unremarkable. Other: No ascites.  No free air. Musculoskeletal: No acute or significant osseous findings. IMPRESSION: Acute sigmoid diverticulitis with  microperforation.  No abscess. Electronically Signed   By: JONETTA Faes M.D.   On: 10/23/2023 18:03   Family Communication: Discussed with patient, wife at bedside, they understand and agree. All questions answereed.  Disposition: Status is: changed to Inpatient Remains inpatient appropriate because: sepsis, Diverticulitis, IV pain control, IV antibiotics, repeat CT a/p tomorrow.  Planned Discharge Destination: Home     Time spent: 39 minutes  Author: Concepcion Riser, MD 10/25/2023 4:38 PM Secure chat 7am to 7pm For on call review [email or web address].

## 2023-10-25 NOTE — Progress Notes (Signed)
 Pharmacy Antibiotic Note  William Curtis is a 38 y.o. male admitted on 10/23/2023 with  intra-abdominal infection . Patient presented to ED with abdominal pain, fevers, and chills. CT a/p showed acute sigmoid diverticulitis with microperforation, no abscess. Pharmacy has been consulted for Zosyn  dosing.   Plan: Start Zosyn  3.375 g IV q8h Monitor for improvement in signs/symptoms, culture data, LOT, and transition to PO when appropriate   Height: 5' 10 (177.8 cm) Weight: 121.3 kg (267 lb 6.7 oz) IBW/kg (Calculated) : 73  Temp (24hrs), Avg:99.1 F (37.3 C), Min:97.9 F (36.6 C), Max:101.8 F (38.8 C)  Recent Labs  Lab 10/23/23 1220 10/24/23 0710 10/24/23 2023 10/24/23 2151 10/25/23 0620  WBC 13.7* 12.6*  --   --  9.2  CREATININE 1.16 1.02  --   --  0.93  LATICACIDVEN  --   --  0.8 0.9  --     Estimated Creatinine Clearance: 140.6 mL/min (by C-G formula based on SCr of 0.93 mg/dL).    Allergies  Allergen Reactions   Zolmitriptan Other (See Comments)    Chest tightness and numbness in side  Other Reaction(s): Other (See Comments)   Ciprofloxacin Rash   Oxycodone-Acetaminophen  Rash    Antimicrobials this admission: 2/4-2/6 Ceftriaxone  2/4-2/6 Metronidazole  2/6 Zosyn  >>    Microbiology results: 2/4 BCx: NGTD   Thank you for allowing pharmacy to be a part of this patient's care.  Lum VEAR Mania, PharmD Clinical Pharmacist  10/25/2023 11:02 AM

## 2023-10-25 NOTE — Plan of Care (Signed)

## 2023-10-25 NOTE — Progress Notes (Signed)
 CC: diverticulitis w microperf Subjective: Clinincally improving slowly, did spiked fevers  Objective: Vital signs in last 24 hours: Temp:  [97.9 F (36.6 C)-101.8 F (38.8 C)] 98.1 F (36.7 C) (02/06 1138) Pulse Rate:  [62-106] 67 (02/06 1138) Resp:  [17-25] 18 (02/06 1138) BP: (107-137)/(70-81) 120/79 (02/06 1138) SpO2:  [89 %-99 %] 99 % (02/06 1138) Weight:  [121.3 kg] 121.3 kg (02/05 1944) Last BM Date : 10/22/23  Intake/Output from previous day: 02/05 0701 - 02/06 0700 In: 304.4 [IV Piggyback:304.4] Out: -  Intake/Output this shift: Total I/O In: 450 [I.V.:400; IV Piggyback:50] Out: 600 [Urine:600]  Physical exam:    Lab Results: CBC  ONSTITUTIONAL: NAD EYES: Pupils are equal, round, Sclera are non-icteric.GI: The abdomen is  soft, tenderness to palpation in the left lower quadrant and suprapubic area.  No rebound and no peritonitis and nondistended. There are no palpable masses. Some improvement as compared to yesterday GU: Rectal deferred.   MUSCULOSKELETAL: Normal muscle strength and tone. No cyanosis or edema.   SKIN: Turgor is good and there are no pathologic skin lesions or ulcers. NEUROLOGIC: Motor and sensation is grossly normal. Cranial nerves are grossly intact. PSYCH:  Oriented to person, place and time. Affect is normal.  BMET Recent Labs    10/24/23 0710 10/25/23 0620  NA 135 138  K 3.9 4.1  CL 103 104  CO2 24 28  GLUCOSE 88 95  BUN 17 15  CREATININE 1.02 0.93  CALCIUM 8.6* 8.5*   PT/INR No results for input(s): LABPROT, INR in the last 72 hours. ABG No results for input(s): PHART, HCO3 in the last 72 hours.  Invalid input(s): PCO2, PO2  Studies/Results: DG Chest 1 View Result Date: 10/24/2023 CLINICAL DATA:  Fever EXAM: CHEST  1 VIEW COMPARISON:  10/15/2014 FINDINGS: Low lung volumes. No focal consolidation, pleural effusion or pneumothorax IMPRESSION: Low lung volumes. Electronically Signed   By: Luke Bun M.D.   On:  10/24/2023 20:04   CT ABDOMEN PELVIS W CONTRAST Result Date: 10/23/2023 CLINICAL DATA:  Right lower quadrant pain, chills EXAM: CT ABDOMEN AND PELVIS WITH CONTRAST TECHNIQUE: Multidetector CT imaging of the abdomen and pelvis was performed using the standard protocol following bolus administration of intravenous contrast. RADIATION DOSE REDUCTION: This exam was performed according to the departmental dose-optimization program which includes automated exposure control, adjustment of the mA and/or kV according to patient size and/or use of iterative reconstruction technique. CONTRAST:  OMNIPAQUE  IOHEXOL  350 MG/ML SOLN COMPARISON:  03/31/2019 FINDINGS: Lower chest: No pleural or pericardial effusion. Minimal dependent atelectasis posteriorly in the lung bases. Hepatobiliary: No focal liver abnormality is seen. No gallstones, gallbladder wall thickening, or biliary dilatation. Pancreas: Unremarkable. No pancreatic ductal dilatation or surrounding inflammatory changes. Spleen: Normal in size without focal abnormality. Adrenals/Urinary Tract: Adrenal glands are unremarkable. Kidneys are normal, without renal calculi, focal lesion, or hydronephrosis. Bladder is unremarkable. Stomach/Bowel: Stomach nondistended, unremarkable. Small bowel decompressed. Normal appendix. The colon is partially distended. Multiple distal descending and proximal sigmoid diverticula. Cluster of extraluminal gas bubbles in the mesentery of the proximal sigmoid colon in the left lower quadrant, with regional inflammatory changes. No abscess. Vascular/Lymphatic: Normal abdominal aorta. Circumaortic left renal vein, anatomic variant. Portal vein patent. No abdominal or pelvic adenopathy. Reproductive: Prostate is unremarkable. Other: No ascites.  No free air. Musculoskeletal: No acute or significant osseous findings. IMPRESSION: Acute sigmoid diverticulitis with  microperforation.  No abscess. Electronically Signed   By: JONETTA Faes M.D.   On:  10/23/2023  18:03    Anti-infectives: Anti-infectives (From admission, onward)    Start     Dose/Rate Route Frequency Ordered Stop   10/25/23 1200  piperacillin -tazobactam (ZOSYN ) IVPB 3.375 g        3.375 g 12.5 mL/hr over 240 Minutes Intravenous Every 8 hours 10/25/23 1108     10/24/23 0630  metroNIDAZOLE  (FLAGYL ) IVPB 500 mg  Status:  Discontinued        500 mg 100 mL/hr over 60 Minutes Intravenous Every 12 hours 10/23/23 1928 10/25/23 1108   10/23/23 1930  cefTRIAXone  (ROCEPHIN ) 2 g in sodium chloride  0.9 % 100 mL IVPB  Status:  Discontinued        2 g 200 mL/hr over 30 Minutes Intravenous Every 24 hours 10/23/23 1927 10/25/23 1108   10/23/23 1845  cefTRIAXone  (ROCEPHIN ) 2 g in sodium chloride  0.9 % 100 mL IVPB  Status:  Discontinued        2 g 200 mL/hr over 30 Minutes Intravenous  Once 10/23/23 1843 10/23/23 1927   10/23/23 1830  metroNIDAZOLE  (FLAGYL ) IVPB 500 mg        500 mg 100 mL/hr over 60 Minutes Intravenous  Once 10/23/23 1825 10/23/23 1924       Assessment/Plan:  Micro perf w recurrent diverticulitis Change to zosyn  Repeat CT in am No need for surgical intervention I spent 35 min in this encounter including counseling, documenting and reviewing records.  Laneta Luna, MD, [facility]  10/25/2023

## 2023-10-26 ENCOUNTER — Inpatient Hospital Stay: Payer: 59

## 2023-10-26 DIAGNOSIS — A419 Sepsis, unspecified organism: Secondary | ICD-10-CM | POA: Diagnosis not present

## 2023-10-26 DIAGNOSIS — K5732 Diverticulitis of large intestine without perforation or abscess without bleeding: Secondary | ICD-10-CM | POA: Diagnosis not present

## 2023-10-26 DIAGNOSIS — K5720 Diverticulitis of large intestine with perforation and abscess without bleeding: Secondary | ICD-10-CM | POA: Diagnosis not present

## 2023-10-26 LAB — CBC
HCT: 40.4 % (ref 39.0–52.0)
Hemoglobin: 13.9 g/dL (ref 13.0–17.0)
MCH: 31.8 pg (ref 26.0–34.0)
MCHC: 34.4 g/dL (ref 30.0–36.0)
MCV: 92.4 fL (ref 80.0–100.0)
Platelets: 239 10*3/uL (ref 150–400)
RBC: 4.37 MIL/uL (ref 4.22–5.81)
RDW: 12.3 % (ref 11.5–15.5)
WBC: 7 10*3/uL (ref 4.0–10.5)
nRBC: 0 % (ref 0.0–0.2)

## 2023-10-26 LAB — BASIC METABOLIC PANEL
Anion gap: 10 (ref 5–15)
BUN: 14 mg/dL (ref 6–20)
CO2: 29 mmol/L (ref 22–32)
Calcium: 9.2 mg/dL (ref 8.9–10.3)
Chloride: 101 mmol/L (ref 98–111)
Creatinine, Ser: 1.06 mg/dL (ref 0.61–1.24)
GFR, Estimated: >60 mL/min (ref >60)
Glucose, Bld: 78 mg/dL (ref 70–99)
Potassium: 4.2 mmol/L (ref 3.5–5.1)
Sodium: 140 mmol/L (ref 135–145)

## 2023-10-26 MED ORDER — IOHEXOL 9 MG/ML PO SOLN
500.0000 mL | ORAL | Status: AC
Start: 2023-10-26 — End: 2023-10-26
  Administered 2023-10-26 (×2): 500 mL via ORAL

## 2023-10-26 MED ORDER — METRONIDAZOLE 500 MG PO TABS
500.0000 mg | ORAL_TABLET | Freq: Two times a day (BID) | ORAL | Status: DC
  Administered 2023-10-26 – 2023-10-27 (×3): 500 mg via ORAL
  Filled 2023-10-26 (×3): qty 1

## 2023-10-26 MED ORDER — LEVOFLOXACIN 750 MG PO TABS
750.0000 mg | ORAL_TABLET | Freq: Every day | ORAL | Status: DC
  Administered 2023-10-26: 750 mg via ORAL
  Filled 2023-10-26: qty 1

## 2023-10-26 MED ORDER — IOHEXOL 300 MG/ML  SOLN
100.0000 mL | Freq: Once | INTRAMUSCULAR | Status: AC | PRN
  Administered 2023-10-26: 100 mL via INTRAVENOUS

## 2023-10-26 NOTE — Progress Notes (Signed)
 CC: diverticulitis w microperf Subjective: Clinincally improving slowly, no fever but low grade temp. Tolerated clears and more lucid today . He Did have a repeat CT showing evidence of contained perforated diverticulitis without abscess.  No evidence of free perforation, I have personally reviewed the images and shared with the patient and the wife the results    Objective: Vital signs in last 24 hours: Temp:  [97.8 F (36.6 C)-100.4 F (38 C)] 97.8 F (36.6 C) (02/07 0756) Pulse Rate:  [56-63] 56 (02/07 0756) Resp:  [18] 18 (02/06 2140) BP: (114-126)/(67-84) 114/67 (02/07 0756) SpO2:  [93 %-98 %] 96 % (02/07 0756) Last BM Date : 10/22/23  Intake/Output from previous day: 02/06 0701 - 02/07 0700 In: 1400.2 [P.O.:240; I.V.:1060.2; IV Piggyback:100] Out: 1300 [Urine:1300] Intake/Output this shift: Total I/O In: 0  Out: 700 [Urine:700]  Physical exam: CONSTITUTIONAL: NAD, alert in good spirits GI: The abdomen is  soft, tenderness to palpation in the left lower quadrant and suprapubic area.  No rebound and no peritonitis and nondistended. There are no palpable masses. Some improvement as compared to yesterday GU: Rectal deferred.   MUSCULOSKELETAL: Normal muscle strength and tone. No cyanosis or edema.   SKIN: Turgor is good and there are no pathologic skin lesions or ulcers. NEUROLOGIC: Motor and sensation is grossly normal. Cranial nerves are grossly intact. PSYCH:  Oriented to person, place and time. Affect is normal.   BMET Lab Results: CBC  Recent Labs    10/25/23 0620 10/26/23 0333  WBC 9.2 7.0  HGB 13.2 13.9  HCT 37.7* 40.4  PLT 210 239   BMET Recent Labs    10/25/23 0620 10/26/23 0333  NA 138 140  K 4.1 4.2  CL 104 101  CO2 28 29  GLUCOSE 95 78  BUN 15 14  CREATININE 0.93 1.06  CALCIUM 8.5* 9.2   PT/INR No results for input(s): LABPROT, INR in the last 72 hours. ABG No results for input(s): PHART, HCO3 in the last 72 hours.  Invalid  input(s): PCO2, PO2  Studies/Results: CT ABDOMEN PELVIS W CONTRAST Result Date: 10/26/2023 CLINICAL DATA:  Diverticulitis, complication suspected. EXAM: CT ABDOMEN AND PELVIS WITH CONTRAST TECHNIQUE: Multidetector CT imaging of the abdomen and pelvis was performed using the standard protocol following bolus administration of intravenous contrast. RADIATION DOSE REDUCTION: This exam was performed according to the departmental dose-optimization program which includes automated exposure control, adjustment of the mA and/or kV according to patient size and/or use of iterative reconstruction technique. CONTRAST:  OMNIPAQUE  IOHEXOL  300 MG/ML  SOLN COMPARISON:  CT of the abdomen and pelvis with contrast 10/23/2023 FINDINGS: Lower chest: Mild dependent atelectasis is present in both lungs. Minimal atelectasis is present in the lingula. The heart size is normal. No significant pleural or pericardial effusion is present. Hepatobiliary: Diffuse fatty infiltration liver is present. No discrete lesions are present. The common bile duct and gallbladder are normal. Pancreas: Unremarkable. No pancreatic ductal dilatation or surrounding inflammatory changes. Spleen: Normal in size without focal abnormality. Adrenals/Urinary Tract: Adrenal glands are normal bilaterally. Kidneys are unremarkable. No stone or mass lesion is present. Obstruction is present. The ureters are within normal limits. The urinary bladder is normal. Stomach/Bowel: The stomach and duodenum are within normal limits. Small bowel is unremarkable. Terminal ileum is within normal limits. The appendix is visualized and normal. Contrast is present in the terminal ileum and colon. The ascending and transverse colon are within normal limits. Diverticular changes are present the distal descending colon. Diffuse inflammatory  changes are again noted about the sigmoid colon. The contained perforation fills with contrast measuring 3.1 x 1.0 cm on sagittal images  no free air other free fluid is present. More distal sigmoid colon and rectum are within normal limits. Vascular/Lymphatic: No significant vascular findings are present. No enlarged abdominal or pelvic lymph nodes. Reproductive: Prostate is unremarkable. Other: No abdominal wall hernia or abnormality. No abdominopelvic ascites. Musculoskeletal: Vertebral body heights and alignment are normal. No focal osseous lesions are present. Bony pelvis is normal. The hips are located and within normal limits. IMPRESSION: 1. Acute sigmoid diverticulitis with contained perforation measuring 3.1 x 1.0 cm on sagittal images. 2. Hepatic steatosis. Electronically Signed   By: Lonni Necessary M.D.   On: 10/26/2023 12:48   DG Chest 1 View Result Date: 10/24/2023 CLINICAL DATA:  Fever EXAM: CHEST  1 VIEW COMPARISON:  10/15/2014 FINDINGS: Low lung volumes. No focal consolidation, pleural effusion or pneumothorax IMPRESSION: Low lung volumes. Electronically Signed   By: Luke Bun M.D.   On: 10/24/2023 20:04    Anti-infectives: Anti-infectives (From admission, onward)    Start     Dose/Rate Route Frequency Ordered Stop   10/26/23 1515  metroNIDAZOLE  (FLAGYL ) tablet 500 mg        500 mg Oral 2 times daily 10/26/23 1420     10/26/23 1515  levofloxacin  (LEVAQUIN ) tablet 750 mg        750 mg Oral Daily 10/26/23 1420     10/25/23 1200  piperacillin -tazobactam (ZOSYN ) IVPB 3.375 g  Status:  Discontinued        3.375 g 12.5 mL/hr over 240 Minutes Intravenous Every 8 hours 10/25/23 1108 10/26/23 1420   10/24/23 0630  metroNIDAZOLE  (FLAGYL ) IVPB 500 mg  Status:  Discontinued        500 mg 100 mL/hr over 60 Minutes Intravenous Every 12 hours 10/23/23 1928 10/25/23 1108   10/23/23 1930  cefTRIAXone  (ROCEPHIN ) 2 g in sodium chloride  0.9 % 100 mL IVPB  Status:  Discontinued        2 g 200 mL/hr over 30 Minutes Intravenous Every 24 hours 10/23/23 1927 10/25/23 1108   10/23/23 1845  cefTRIAXone  (ROCEPHIN ) 2 g in sodium  chloride 0.9 % 100 mL IVPB  Status:  Discontinued        2 g 200 mL/hr over 30 Minutes Intravenous  Once 10/23/23 1843 10/23/23 1927   10/23/23 1830  metroNIDAZOLE  (FLAGYL ) IVPB 500 mg        500 mg 100 mL/hr over 60 Minutes Intravenous  Once 10/23/23 1825 10/23/23 1924       Assessment/Plan: 38 year old male with recurrent complicated diverticulitis responded to antibiotic therapy.  No need for surgical intervention.  I had an extensive discussion with the patient and the family regarding his disease process.  I do think that we can advance his diet and once he tolerated diet probably he can go home with p.o. antibiotics and another 10-day completion of antibiotic therapy.  Discussed with Dr.Sreeram in detail. I Will see him in a few weeks in my office.  We had a good discussion regarding elective sigmoid colectomy in the outpatient setting given his age recurrence and evidence of complicated diverticulitis. We will be available Please note that I spent greater than 50 minutes in this encounter including personally reviewing imaging studies, coordinating his care, counseling the patient and performing documentation    Laneta Luna, MD, FACS  10/26/2023

## 2023-10-26 NOTE — Progress Notes (Signed)
 Pharmacy Antibiotic Note  William Curtis is a 38 y.o. male admitted on 10/23/2023 with  intra-abdominal infection . Patient presented to ED with abdominal pain, fevers, and chills. CT a/p showed acute sigmoid diverticulitis with microperforation, no abscess. Pharmacy has been consulted for Zosyn  dosing.  Per surgery patient slowly improving but did spike a fever  Tmax 100.4 WBC 12.6>9.2>7 Scr 1.06- stable   Plan: Continue Zosyn  3.375 g IV q8h Monitor for improvement in signs/symptoms, culture data, LOT, and transition to PO when appropriate   Height: 5' 10 (177.8 cm) Weight: 121.3 kg (267 lb 6.7 oz) IBW/kg (Calculated) : 73  Temp (24hrs), Avg:98.8 F (37.1 C), Min:97.8 F (36.6 C), Max:100.4 F (38 C)  Recent Labs  Lab 10/23/23 1220 10/24/23 0710 10/24/23 2023 10/24/23 2151 10/25/23 0620 10/26/23 0333  WBC 13.7* 12.6*  --   --  9.2 7.0  CREATININE 1.16 1.02  --   --  0.93 1.06  LATICACIDVEN  --   --  0.8 0.9  --   --     Estimated Creatinine Clearance: 123.4 mL/min (by C-G formula based on SCr of 1.06 mg/dL).    Allergies  Allergen Reactions   Zolmitriptan Other (See Comments)    Chest tightness and numbness in side  Other Reaction(s): Other (See Comments)   Ciprofloxacin Rash   Oxycodone-Acetaminophen  Rash    Antimicrobials this admission: 2/4-2/6 Ceftriaxone  2/4-2/6 Metronidazole  2/6 Zosyn  >>    Microbiology results: 2/4 BCx: NGTD   Thank you for allowing pharmacy to be a part of this patient's care.  Lum VEAR Mania, PharmD Clinical Pharmacist  10/26/2023 12:22 PM

## 2023-10-26 NOTE — Plan of Care (Signed)

## 2023-10-26 NOTE — Progress Notes (Signed)
 Progress Note   Patient: William Curtis FMW:[id number] DOB: [birth year] DOA: 10/23/2023     2 DOS: the patient was seen and examined on 10/26/2023   Brief hospital course: Mr. Daniil Labarge is a 38 year old male with history of diverticulosis with history of diverticulitis, obsessive-compulsive disorder, PTSD, with history of rhabdomyolysis, acute kidney injury, who presents emergency department for chief concerns of abdominal pain, fever, chills. Patient is admitted for further management and evaluation of Acute sigmoid diverticulitis with micro perforation on IV antibiotics. Gen surgery team consulted.  Assessment and Plan: * Acute sigmoid Diverticulitis Sepsis with no organ failure Continues to have low grade fever. Tachycardia, tachypnea improved. Lower abdominal discomfort better. Chest x-ray, lactic acid unremarkable. Blood cultures so far negative. Repeat CT a/p showed Acute sigmoid diverticulitis with contained perforation measuring 3.1 x 1.0 cm on sagittal images. Dr. Jordis advised to advance diet, PO antibiotics, no surgical intervention, outpatient follow up. He has cipro allergy per records, discussed with pharmacist, trial of levoflox tonight. Zosyn  q8hr to be changed to Levofloxacin +flagyl . Diet changed to soft diet. Appetite better.  Continue pain control with morphine .  Morbid obesity due to excess calories (HCC) BMI 38.37 Complicates overall care and prognosis. Diet, weight reduction counseled.  Anxiety attack OCD (obsessive compulsive disorder) Hold sertraline, Buspirone, bupropion for now. Outpatient follow up with PCP once acute issues resolve.    Out of bed to chair. Incentive spirometry. Nursing supportive care. Fall, aspiration precautions. DVT prophylaxis   Code Status: Full Code  Subjective: Patient is seen and examined today morning. He has lower abdominal discomfort, low grade fever. Tmax 100.4. Got repeat CT abdomen. Wife at bedside.  Physical  Exam: Vitals:   10/25/23 1747 10/25/23 2140 10/26/23 0345 10/26/23 0756  BP: 117/83 126/73 115/84 114/67  Pulse: (!) 57 63 (!) 56 (!) 56  Resp: 18 18    Temp: (!) 100.4 F (38 C) 99 F (37.2 C) 97.8 F (36.6 C) 97.8 F (36.6 C)  TempSrc:   Oral Oral  SpO2: 96% 93% 98% 96%  Weight:      Height:        General - Young  obese Caucasian male, no apparent distress HEENT - PERRLA, EOMI, atraumatic head, non tender sinuses. Lung - Clear, basal rales, no rhonchi, wheezes. Heart - S1, S2 heard, no murmurs, rubs, no pedal edema. Abdomen - Soft, lower abdomen tender, no guarding, bowel sounds good Neuro - Alert, awake and oriented x 3, non focal exam. Skin - Warm and dry.  Data Reviewed:      Latest Ref Rng & Units 10/26/2023    3:33 AM 10/25/2023    6:20 AM 10/24/2023    7:10 AM  CBC  WBC 4.0 - 10.5 K/uL 7.0  9.2  12.6   Hemoglobin 13.0 - 17.0 g/dL 86.0  86.7  86.7   Hematocrit 39.0 - 52.0 % 40.4  37.7  38.4   Platelets 150 - 400 K/uL 239  210  207       Latest Ref Rng & Units 10/26/2023    3:33 AM 10/25/2023    6:20 AM 10/24/2023    7:10 AM  BMP  Glucose 70 - 99 mg/dL 78  95  88   BUN 6 - 20 mg/dL 14  15  17    Creatinine 0.61 - 1.24 mg/dL 8.93  9.06  8.97   Sodium 135 - 145 mmol/L 140  138  135   Potassium 3.5 - 5.1 mmol/L 4.2  4.1  3.9  Chloride 98 - 111 mmol/L 101  104  103   CO2 22 - 32 mmol/L 29  28  24    Calcium 8.9 - 10.3 mg/dL 9.2  8.5  8.6    CT ABDOMEN PELVIS W CONTRAST Result Date: 10/26/2023 CLINICAL DATA:  Diverticulitis, complication suspected. EXAM: CT ABDOMEN AND PELVIS WITH CONTRAST TECHNIQUE: Multidetector CT imaging of the abdomen and pelvis was performed using the standard protocol following bolus administration of intravenous contrast. RADIATION DOSE REDUCTION: This exam was performed according to the departmental dose-optimization program which includes automated exposure control, adjustment of the mA and/or kV according to patient size and/or use of iterative  reconstruction technique. CONTRAST:  OMNIPAQUE  IOHEXOL  300 MG/ML  SOLN COMPARISON:  CT of the abdomen and pelvis with contrast 10/23/2023 FINDINGS: Lower chest: Mild dependent atelectasis is present in both lungs. Minimal atelectasis is present in the lingula. The heart size is normal. No significant pleural or pericardial effusion is present. Hepatobiliary: Diffuse fatty infiltration liver is present. No discrete lesions are present. The common bile duct and gallbladder are normal. Pancreas: Unremarkable. No pancreatic ductal dilatation or surrounding inflammatory changes. Spleen: Normal in size without focal abnormality. Adrenals/Urinary Tract: Adrenal glands are normal bilaterally. Kidneys are unremarkable. No stone or mass lesion is present. Obstruction is present. The ureters are within normal limits. The urinary bladder is normal. Stomach/Bowel: The stomach and duodenum are within normal limits. Small bowel is unremarkable. Terminal ileum is within normal limits. The appendix is visualized and normal. Contrast is present in the terminal ileum and colon. The ascending and transverse colon are within normal limits. Diverticular changes are present the distal descending colon. Diffuse inflammatory changes are again noted about the sigmoid colon. The contained perforation fills with contrast measuring 3.1 x 1.0 cm on sagittal images no free air other free fluid is present. More distal sigmoid colon and rectum are within normal limits. Vascular/Lymphatic: No significant vascular findings are present. No enlarged abdominal or pelvic lymph nodes. Reproductive: Prostate is unremarkable. Other: No abdominal wall hernia or abnormality. No abdominopelvic ascites. Musculoskeletal: Vertebral body heights and alignment are normal. No focal osseous lesions are present. Bony pelvis is normal. The hips are located and within normal limits. IMPRESSION: 1. Acute sigmoid diverticulitis with contained perforation measuring  3.1 x 1.0 cm on sagittal images. 2. Hepatic steatosis. Electronically Signed   By: Lonni Necessary M.D.   On: 10/26/2023 12:48   DG Chest 1 View Result Date: 10/24/2023 CLINICAL DATA:  Fever EXAM: CHEST  1 VIEW COMPARISON:  10/15/2014 FINDINGS: Low lung volumes. No focal consolidation, pleural effusion or pneumothorax IMPRESSION: Low lung volumes. Electronically Signed   By: Luke Bun M.D.   On: 10/24/2023 20:04   Family Communication: Discussed with patient, wife at bedside, they understand and agree. All questions answereed.  Disposition: Status is: changed to Inpatient Remains inpatient appropriate because: sepsis, Diverticulitis, IV pain control, advance diet  Planned Discharge Destination: Home     Time spent: 37 minutes  Author: Concepcion Riser, MD 10/26/2023 3:05 PM Secure chat 7am to 7pm For on call review [email or web address].

## 2023-10-26 NOTE — TOC CM/SW Note (Signed)
 Transition of Care [facility]) - Inpatient Brief Assessment   Patient Details  Name: William Curtis MRN: [id number] Date of Birth: December 18, 1985  [facility] CM/SW Contact:    Ladene Lady, LCSW Phone Number: 10/26/2023, 3:30 PM   Clinical Narrative:    Transition of Care Asessment: Insurance and Status: Insurance coverage has been reviewed Patient has primary care physician: Yes Home environment has been reviewed: home with wife Prior level of function:: independent Prior/Current Home Services: No current home services Social Drivers of Health Review: SDOH reviewed no interventions necessary Readmission risk has been reviewed: Yes Transition of care needs: no transition of care needs at this time

## 2023-10-26 NOTE — Progress Notes (Addendum)
 Pharmacy - Brief Note (ciprofloxacin allergy clarification)   History of reaction, Patient believes it was 2011 when he was in hospital he developed rash and thought was it was due to ciprofloxacin.  He was admitted for AKI and rhabdomyolysis which was present prior to start of ciprofloxacin.  Patient recalls getting rash (did not involve mucus membranes) and antibiotic was stopped without any additional therapy that he is aware of.  Patient currently admitted with diverticulitis and surgery wishes for patient to go home on quinolone therapy (For pseudomonas coverage per report I received).    After discussing with patient and Dr Darci, did not appear to have serious allergy to ciprofloxacin and study showed that most patients tolerate a different quinolone if had previous reaction to a quinolone. Decision made to try levofloxacin  (with metronidazole ).  He will get a dose tonight and hopefully in morning prior to discharge to evaluate if tolerates.     Robbert Langlinais, PharmD, BCPS, BCIDP Work Cell: [phone] 10/26/2023 4:20 PM

## 2023-10-26 NOTE — Discharge Instructions (Signed)
 Diverticulitis  Diverticulitis is when small pouches in your colon get infected or swollen. This causes pain in your belly (abdomen) and watery poop (diarrhea). The small pouches are called diverticula. They may form if you have a condition called diverticulosis. What are the causes? You may get this condition if poop (stool) gets trapped in the pouches in your colon. The poop lets germs (bacteria) grow. This causes an infection. What increases the risk? You are more likely to get this condition if you have small pouches in your colon. You are also more likely to get it if: You are overweight or very overweight (obese). You do not exercise enough. You drink alcohol. You smoke. You eat a lot of red meat, like beef, pork, or lamb. You do not eat enough fiber. You are older than 39 years of age. What are the signs or symptoms? Pain in your belly. Pain is often on the left side, but it may be felt in other spots too. Fever and chills. Feeling like you may vomit. Vomiting. Having cramps. Feeling full. Changes in how often you poop. Blood in your poop. How is this treated? Most cases are treated at home. You may be told to: Take over-the-counter pain medicines. Only eat and drink clear liquids. Take antibiotics. Rest. Very bad cases may need to be treated at a hospital. Treatment may include: Not eating or drinking. Taking pain medicines. Getting antibiotics through an IV tube. Getting fluid and food through an IV tube. Having surgery. When you are feeling better, you may need to have a test to look at your colon (colonoscopy). Follow these instructions at home: Medicines Take over-the-counter and prescription medicines only as told by your doctor. These include: Fiber pills. Probiotics. Medicines to make your poop soft (stool softeners). If you were prescribed antibiotics, take them as told by your doctor. Do not stop taking them even if you start to feel better. Ask your  doctor if you should avoid driving or using machines while you are taking your medicine. Eating and drinking  Follow the diet told by your doctor. You may need to only eat and drink liquids. When you feel better, you may be able to eat more foods. You may also be told to eat a lot of fiber. Fiber helps you poop. Foods with fiber include berries, beans, lentils, and green vegetables. Try not to eat red meat. General instructions Do not smoke or use any products that contain nicotine or tobacco. If you need help quitting, ask your doctor. Exercise 3 or more times a week. Try to go for 30 minutes each time. Exercise enough to sweat and make your heart beat faster. Contact a doctor if: Your pain gets worse. You are not pooping like normal. Your symptoms do not get better. Your symptoms get worse very fast. You have a fever. You vomit more than one time. You have poop that is: Bloody. Black. Tarry. This information is not intended to replace advice given to you by your health care provider. Make sure you discuss any questions you have with your health care provider. Document Revised: 06/01/2022 Document Reviewed: 06/01/2022 Elsevier Patient Education  2024 [employer].

## 2023-10-27 DIAGNOSIS — K5732 Diverticulitis of large intestine without perforation or abscess without bleeding: Secondary | ICD-10-CM | POA: Diagnosis not present

## 2023-10-27 DIAGNOSIS — F4310 Post-traumatic stress disorder, unspecified: Secondary | ICD-10-CM

## 2023-10-27 DIAGNOSIS — F410 Panic disorder [episodic paroxysmal anxiety] without agoraphobia: Secondary | ICD-10-CM | POA: Diagnosis not present

## 2023-10-27 DIAGNOSIS — R1030 Lower abdominal pain, unspecified: Secondary | ICD-10-CM

## 2023-10-27 LAB — BASIC METABOLIC PANEL
Anion gap: 10 (ref 5–15)
BUN: 17 mg/dL (ref 6–20)
CO2: 27 mmol/L (ref 22–32)
Calcium: 9.8 mg/dL (ref 8.9–10.3)
Chloride: 102 mmol/L (ref 98–111)
Creatinine, Ser: 1.09 mg/dL (ref 0.61–1.24)
GFR, Estimated: >60 mL/min (ref >60)
Glucose, Bld: 93 mg/dL (ref 70–99)
Potassium: 4.5 mmol/L (ref 3.5–5.1)
Sodium: 139 mmol/L (ref 135–145)

## 2023-10-27 LAB — CBC
HCT: 43.3 % (ref 39.0–52.0)
Hemoglobin: 15.1 g/dL (ref 13.0–17.0)
MCH: 31.5 pg (ref 26.0–34.0)
MCHC: 34.9 g/dL (ref 30.0–36.0)
MCV: 90.2 fL (ref 80.0–100.0)
Platelets: 290 10*3/uL (ref 150–400)
RBC: 4.8 MIL/uL (ref 4.22–5.81)
RDW: 12.4 % (ref 11.5–15.5)
WBC: 6.3 10*3/uL (ref 4.0–10.5)
nRBC: 0 % (ref 0.0–0.2)

## 2023-10-27 MED ORDER — METRONIDAZOLE 500 MG PO TABS: 500.0000 mg | ORAL_TABLET | Freq: Two times a day (BID) | ORAL | 0 refills | Status: AC

## 2023-10-27 MED ORDER — LEVOFLOXACIN 750 MG PO TABS: 750.0000 mg | ORAL_TABLET | Freq: Every day | ORAL | 0 refills | Status: AC

## 2023-10-27 MED ORDER — BUTALBITAL-APAP-CAFFEINE 50-325-40 MG PO TABS: 1.0000 | ORAL_TABLET | Freq: Four times a day (QID) | ORAL | 0 refills | Status: AC | PRN

## 2023-10-27 NOTE — Progress Notes (Signed)
 Patient and wife verbalized understanding of all discharge instructions, including importance of medication adherence and follow up. Pt d/c in stable condition to private vehicle with wife.

## 2023-10-27 NOTE — TOC Transition Note (Signed)
 Transition of Care [facility]) - Discharge Note   Patient Details  Name: William Curtis MRN: [id number] Date of Birth: 05-Oct-1985  Transition of Care [facility]) CM/SW Contact:  Heron KATHEE Edison, RN Phone Number: 10/27/2023, 9:52 AM   Clinical Narrative:  2/8: Discharge orders in for home/self care. To prior TOC need identified. [facility] and [facility] [employer].   PCP: Alla Amis, MD [phone]   Final next level of care: Home/Self Care Barriers to Discharge: No Barriers Identified, Barriers Resolved   Patient Goals and CMS Choice            Discharge Placement                       Discharge Plan and Services Additional resources added to the After Visit Summary for                  DME Arranged: N/A DME Agency: NA       HH Arranged: NA HH Agency: NA        Social Drivers of Health (SDOH) Interventions SDOH Screenings   Food Insecurity: No Food Insecurity (10/25/2023)  Housing: Low Risk  (10/25/2023)  Transportation Needs: No Transportation Needs (10/25/2023)  Utilities: Not At Risk (10/25/2023)  Financial Resource Strain: Low Risk  (05/16/2023)   Received from [facility]  Tobacco Use: Low Risk  (10/23/2023)     Readmission Risk Interventions     No data to display

## 2023-10-27 NOTE — Discharge Summary (Signed)
 Physician Discharge Summary   Patient: William Curtis MRN: [id number] DOB: 1986/04/08  Admit date:     10/23/2023  Discharge date: 10/27/23  Discharge Physician: Concepcion Riser   PCP: Alla Amis, MD   Recommendations at discharge:    PCP follow up in 1 week. Complete antibiotic regimen and follow surgery clinic in 2-3 weeks as scheduled  Discharge Diagnoses: Principal Problem:   Diverticulitis of sigmoid colon Active Problems:   SIRS (systemic inflammatory response syndrome) (HCC)   PTSD (post-traumatic stress disorder)   OCD (obsessive compulsive disorder)   Anxiety attack   Leukocytosis   Abdominal pain   Morbid obesity due to excess calories (HCC)   Sepsis (HCC)  Resolved Problems:   * No resolved hospital problems. *  Hospital Course: William Curtis is a 38 year old male with history of diverticulosis with history of diverticulitis, obsessive-compulsive disorder, PTSD, with history of rhabdomyolysis, acute kidney injury, who presents emergency department for chief concerns of abdominal pain, fever, chills. Patient is admitted for further management and evaluation of Acute sigmoid diverticulitis with micro perforation on IV antibiotics. Gen surgery team consulted.  Assessment and Plan: * Acute sigmoid Diverticulitis Sepsis with no organ failure Fever, tachycardia, tachypnea improved. Lower abdominal discomfort better. Chest x-ray, lactic acid unremarkable. Blood cultures so far negative. Repeat CT a/p showed Acute sigmoid diverticulitis with contained perforation measuring 3.1 x 1.0 cm on sagittal images. Dr. Jordis advised to advance diet, PO antibiotics, no surgical intervention, outpatient follow up on 11/19/23. He has cipro allergy per records, discussed with pharmacist, trial of levoflox given which he tolerated. Zosyn  q8hr to be changed to Levofloxacin +flagyl  for 10 days, scripts given. Advised bland soft diet for few days and advance  gradually. Patient and his wife understand and agree with discharge plan. PCP and surgery follow up.   Morbid obesity due to excess calories (HCC) BMI 38.37 Complicates overall care and prognosis. Diet, weight reduction counseled.   Anxiety attack OCD (obsessive compulsive disorder) Resume home sertraline, Buspirone, bupropion Outpatient follow up with PCP once acute issues resolve.       Consultants: Gen surgery Procedures performed: none  Disposition: Home Diet recommendation:  Discharge Diet Orders (From admission, onward)     Start     Ordered   10/27/23 0000  Diet - low sodium heart healthy        10/27/23 0937           Regular diet DISCHARGE MEDICATION: Allergies as of 10/27/2023       Reactions   Zolmitriptan Other (See Comments)   Chest tightness and numbness in side Other Reaction(s): Other (See Comments)   Ciprofloxacin Rash   Oxycodone-acetaminophen  Rash        Medication List     STOP taking these medications    cephALEXin  500 MG capsule Commonly known as: KEFLEX        TAKE these medications    buPROPion 150 MG 24 hr tablet Commonly known as: WELLBUTRIN XL Take 150 mg by mouth daily.   busPIRone 30 MG tablet Commonly known as: BUSPAR Take 30 mg by mouth daily as needed (anxiety).   butalbital -acetaminophen -caffeine  50-325-40 MG tablet Commonly known as: FIORICET  Take 1 tablet by mouth every 6 (six) hours as needed for migraine (severe headaches not relieved with acetaminophen ).   [employer] Extra Strength [id number] MG Pack Generic drug: Aspirin-Acetaminophen -Caffeine  Take 1 packet by mouth daily as needed (headache or pain).   levofloxacin  750 MG tablet Commonly known as: LEVAQUIN  Take 1 tablet (750 mg  total) by mouth daily.   metroNIDAZOLE  500 MG tablet Commonly known as: FLAGYL  Take 1 tablet (500 mg total) by mouth 2 (two) times daily for 10 days.   NON FORMULARY Pt uses a-pap over night   sertraline 100 MG  tablet Commonly known as: ZOLOFT Take 100 mg by mouth daily.        Follow-up Information     [place], [place], MD Follow up on 11/19/2023.   Specialty: General Surgery Contact information: [address] [place] [address] [phone]         Alla Amis, MD Follow up in 1 week(s).   Specialty: Family Medicine Contact information: [address] [facility] [place] [address] [phone]                Discharge Exam: [facility] Weights   10/23/23 1215 10/24/23 1944  Weight: 118.4 kg 121.3 kg      10/27/2023    8:14 AM 10/26/2023    8:59 PM 10/26/2023    4:24 PM  Vitals with BMI  Systolic 128 133 876  Diastolic 82 86 83  Pulse 77 80 69   General - Young  obese Caucasian male, no apparent distress HEENT - PERRLA, EOMI, atraumatic head, non tender sinuses. Lung - Clear, basal rales, no rhonchi, wheezes. Heart - S1, S2 heard, no murmurs, rubs, no pedal edema. Abdomen - Soft, lower abdomen tender, no guarding, bowel sounds good Neuro - Alert, awake and oriented x 3, non focal exam. Skin - Warm and dry.  Condition at discharge: stable  The results of significant diagnostics from this hospitalization (including imaging, microbiology, ancillary and laboratory) are listed below for reference.   Imaging Studies: CT ABDOMEN PELVIS W CONTRAST Result Date: 10/26/2023 CLINICAL DATA:  Diverticulitis, complication suspected. EXAM: CT ABDOMEN AND PELVIS WITH CONTRAST TECHNIQUE: Multidetector CT imaging of the abdomen and pelvis was performed using the standard protocol following bolus administration of intravenous contrast. RADIATION DOSE REDUCTION: This exam was performed according to the departmental dose-optimization program which includes automated exposure control, adjustment of the mA and/or kV according to patient size and/or use of iterative reconstruction technique. CONTRAST:  OMNIPAQUE  IOHEXOL  300 MG/ML  SOLN  COMPARISON:  CT of the abdomen and pelvis with contrast 10/23/2023 FINDINGS: Lower chest: Mild dependent atelectasis is present in both lungs. Minimal atelectasis is present in the lingula. The heart size is normal. No significant pleural or pericardial effusion is present. Hepatobiliary: Diffuse fatty infiltration liver is present. No discrete lesions are present. The common bile duct and gallbladder are normal. Pancreas: Unremarkable. No pancreatic ductal dilatation or surrounding inflammatory changes. Spleen: Normal in size without focal abnormality. Adrenals/Urinary Tract: Adrenal glands are normal bilaterally. Kidneys are unremarkable. No stone or mass lesion is present. Obstruction is present. The ureters are within normal limits. The urinary bladder is normal. Stomach/Bowel: The stomach and duodenum are within normal limits. Small bowel is unremarkable. Terminal ileum is within normal limits. The appendix is visualized and normal. Contrast is present in the terminal ileum and colon. The ascending and transverse colon are within normal limits. Diverticular changes are present the distal descending colon. Diffuse inflammatory changes are again noted about the sigmoid colon. The contained perforation fills with contrast measuring 3.1 x 1.0 cm on sagittal images no free air other free fluid is present. More distal sigmoid colon and rectum are within normal limits. Vascular/Lymphatic: No significant vascular findings are present. No enlarged abdominal or pelvic lymph nodes. Reproductive: Prostate  is unremarkable. Other: No abdominal wall hernia or abnormality. No abdominopelvic ascites. Musculoskeletal: Vertebral body heights and alignment are normal. No focal osseous lesions are present. Bony pelvis is normal. The hips are located and within normal limits. IMPRESSION: 1. Acute sigmoid diverticulitis with contained perforation measuring 3.1 x 1.0 cm on sagittal images. 2. Hepatic steatosis. Electronically Signed    By: Lonni Necessary M.D.   On: 10/26/2023 12:48   DG Chest 1 View Result Date: 10/24/2023 CLINICAL DATA:  Fever EXAM: CHEST  1 VIEW COMPARISON:  10/15/2014 FINDINGS: Low lung volumes. No focal consolidation, pleural effusion or pneumothorax IMPRESSION: Low lung volumes. Electronically Signed   By: Luke Bun M.D.   On: 10/24/2023 20:04   CT ABDOMEN PELVIS W CONTRAST Result Date: 10/23/2023 CLINICAL DATA:  Right lower quadrant pain, chills EXAM: CT ABDOMEN AND PELVIS WITH CONTRAST TECHNIQUE: Multidetector CT imaging of the abdomen and pelvis was performed using the standard protocol following bolus administration of intravenous contrast. RADIATION DOSE REDUCTION: This exam was performed according to the departmental dose-optimization program which includes automated exposure control, adjustment of the mA and/or kV according to patient size and/or use of iterative reconstruction technique. CONTRAST:  OMNIPAQUE  IOHEXOL  350 MG/ML SOLN COMPARISON:  03/31/2019 FINDINGS: Lower chest: No pleural or pericardial effusion. Minimal dependent atelectasis posteriorly in the lung bases. Hepatobiliary: No focal liver abnormality is seen. No gallstones, gallbladder wall thickening, or biliary dilatation. Pancreas: Unremarkable. No pancreatic ductal dilatation or surrounding inflammatory changes. Spleen: Normal in size without focal abnormality. Adrenals/Urinary Tract: Adrenal glands are unremarkable. Kidneys are normal, without renal calculi, focal lesion, or hydronephrosis. Bladder is unremarkable. Stomach/Bowel: Stomach nondistended, unremarkable. Small bowel decompressed. Normal appendix. The colon is partially distended. Multiple distal descending and proximal sigmoid diverticula. Cluster of extraluminal gas bubbles in the mesentery of the proximal sigmoid colon in the left lower quadrant, with regional inflammatory changes. No abscess. Vascular/Lymphatic: Normal abdominal aorta. Circumaortic left renal vein,  anatomic variant. Portal vein patent. No abdominal or pelvic adenopathy. Reproductive: Prostate is unremarkable. Other: No ascites.  No free air. Musculoskeletal: No acute or significant osseous findings. IMPRESSION: Acute sigmoid diverticulitis with  microperforation.  No abscess. Electronically Signed   By: JONETTA Faes M.D.   On: 10/23/2023 18:03    Microbiology: Results for orders placed or performed during the hospital encounter of 10/23/23  Resp panel by RT-PCR (RSV, Flu A&B, Covid) Anterior Nasal Swab     Status: None   Collection Time: 10/23/23  5:08 PM   Specimen: Anterior Nasal Swab  Result Value Ref Range Status   SARS Coronavirus 2 by RT PCR NEGATIVE NEGATIVE Final    Comment: (NOTE) SARS-CoV-2 target nucleic acids are NOT DETECTED.  The SARS-CoV-2 RNA is generally detectable in upper respiratory specimens during the acute phase of infection. The lowest concentration of SARS-CoV-2 viral copies this assay can detect is 138 copies/mL. A negative result does not preclude SARS-Cov-2 infection and should not be used as the sole basis for treatment or other patient management decisions. A negative result may occur with  improper specimen collection/handling, submission of specimen other than nasopharyngeal swab, presence of viral mutation(s) within the areas targeted by this assay, and inadequate number of viral copies(<138 copies/mL). A negative result must be combined with clinical observations, patient history, and epidemiological information. The expected result is Negative.  Fact Sheet for Patients:  bloggercourse.com  Fact Sheet for Healthcare Providers:  seriousbroker.it  This test is no t yet approved or cleared by the [place]  [place] FDA and  has been authorized for detection and/or diagnosis of SARS-CoV-2 by FDA under an Emergency Use Authorization (EUA). This EUA will remain  in effect (meaning this test can be used) for  the duration of the COVID-19 declaration under Section 564(b)(1) of the Act, 21 U.S.C.section 360bbb-3(b)(1), unless the authorization is terminated  or revoked sooner.       Influenza A by PCR NEGATIVE NEGATIVE Final   Influenza B by PCR NEGATIVE NEGATIVE Final    Comment: (NOTE) The Xpert Xpress SARS-CoV-2/FLU/RSV plus assay is intended as an aid in the diagnosis of influenza from Nasopharyngeal swab specimens and should not be used as a sole basis for treatment. Nasal washings and aspirates are unacceptable for Xpert Xpress SARS-CoV-2/FLU/RSV testing.  Fact Sheet for Patients: bloggercourse.com  Fact Sheet for Healthcare Providers: seriousbroker.it  This test is not yet approved or cleared by the [place]  FDA and has been authorized for detection and/or diagnosis of SARS-CoV-2 by FDA under an Emergency Use Authorization (EUA). This EUA will remain in effect (meaning this test can be used) for the duration of the COVID-19 declaration under Section 564(b)(1) of the Act, 21 U.S.C. section 360bbb-3(b)(1), unless the authorization is terminated or revoked.     Resp Syncytial Virus by PCR NEGATIVE NEGATIVE Final    Comment: (NOTE) Fact Sheet for Patients: bloggercourse.com  Fact Sheet for Healthcare Providers: seriousbroker.it  This test is not yet approved or cleared by the [place]  FDA and has been authorized for detection and/or diagnosis of SARS-CoV-2 by FDA under an Emergency Use Authorization (EUA). This EUA will remain in effect (meaning this test can be used) for the duration of the COVID-19 declaration under Section 564(b)(1) of the Act, 21 U.S.C. section 360bbb-3(b)(1), unless the authorization is terminated or revoked.  Performed at [facility] [address] [place], [place] [address]   Culture, blood (Routine X 2) w Reflex to ID Panel      Status: None (Preliminary result)   Collection Time: 10/23/23  8:22 PM   Specimen: Right Antecubital; Blood  Result Value Ref Range Status   Specimen Description RIGHT ANTECUBITAL  Final   Special Requests   Final    BOTTLES DRAWN AEROBIC AND ANAEROBIC Blood Culture adequate volume   Culture   Final    NO GROWTH 4 DAYS Performed at [facility], [address] [place], [place] [address]    Report Status PENDING  Incomplete  Culture, blood (Routine X 2) w Reflex to ID Panel     Status: None (Preliminary result)   Collection Time: 10/23/23  8:24 PM   Specimen: Left Antecubital; Blood  Result Value Ref Range Status   Specimen Description LEFT ANTECUBITAL  Final   Special Requests   Final    BOTTLES DRAWN AEROBIC AND ANAEROBIC Blood Culture results may not be optimal due to an inadequate volume of blood received in culture bottles   Culture   Final    NO GROWTH 4 DAYS Performed at [facility], [address] [place], [place] [address]    Report Status PENDING  Incomplete    Labs: CBC: Recent Labs  Lab 10/23/23 1220 10/24/23 0710 10/25/23 0620 10/26/23 0333 10/27/23 0624  WBC 13.7* 12.6* 9.2 7.0 6.3  HGB 15.1 13.2 13.2 13.9 15.1  HCT 43.1 38.4* 37.7* 40.4 43.3  MCV 91.7 91.9 91.7 92.4 90.2  PLT 241 207 210 239 290   Basic Metabolic Panel: Recent Labs  Lab 10/23/23 1220 10/24/23  0710 10/25/23 0620 10/26/23 0333 10/27/23 0624  NA 137 135 138 140 139  K 4.0 3.9 4.1 4.2 4.5  CL 99 103 104 101 102  CO2 25 24 28 29 27   GLUCOSE 96 88 95 78 93  BUN 16 17 15 14 17   CREATININE 1.16 1.02 0.93 1.06 1.09  CALCIUM 9.3 8.6* 8.5* 9.2 9.8   Liver Function Tests: Recent Labs  Lab 10/23/23 1220  AST 19  ALT 31  ALKPHOS 50  BILITOT 1.6*  PROT 8.0  ALBUMIN 4.5   CBG: No results for input(s): GLUCAP in the last 168 hours.  Discharge time spent: 35 minutes.  Signed: Concepcion Riser, MD [facility] 10/27/2023

## 2023-10-28 LAB — CULTURE, BLOOD (ROUTINE X 2)
Culture: NO GROWTH
Culture: NO GROWTH
Special Requests: ADEQUATE

## 2023-11-16 NOTE — Progress Notes (Signed)
Labs ordered and linked

## 2023-11-16 NOTE — Progress Notes (Signed)
 CC: Preventative Health Exam  HPI  William Curtis is a 38 y.o. here for preventative health exam  Preventative health exam: Recent diverticulitis flare.  Has been taken off of zoloft and wellbutrin.  Currently on buspar and now established with [facility] for psychiatric care.  Chronic medical issues stable and tolerating medications without adverse effects.  Exercises regularly and tries to adhere to healthy diet.  No exertional cp or syncopal episodes.  No urinary issues or rectal pain/bleeding.  No personal or family hx of prostate cancer.  Denies any tobacco use.    ROS Review of systems is unremarkable for any active cardiac, respiratory, GI, GU, hematologic, neurologic, dermatologic, HEENT, or psychiatric symptoms except as noted above.  No fevers, chills, or constitutional symptoms.   Current Outpatient Medications  Medication Sig Dispense Refill  . busPIRone (BUSPAR) 30 MG tablet TAKE 1 TABLET (30 MG TOTAL) BY MOUTH 2 (TWO) TIMES DAILY (Patient taking differently: Take 30 mg by mouth once daily) 180 tablet 1  . butalbital -acetaminophen -caffeine  (FIORICET ) 50-325-40 mg tablet Take 1 tablet by mouth once daily as needed    . hydrOXYzine (ATARAX) 25 MG tablet Take 25 mg by mouth 3 (three) times daily as needed    . naproxen sodium (ALEVE) 220 MG tablet Take 220 mg by mouth 2 (two) times daily as needed for Pain    . buPROPion (WELLBUTRIN XL) 150 MG XL tablet Take 1 tablet (150 mg total) by mouth once daily (Patient not taking: Reported on 11/16/2023) 30 tablet 11   No current facility-administered medications for this visit.    Allergies as of 11/16/2023 - Reviewed 11/16/2023  Allergen Reaction Noted  . Zolmitriptan Other (See Comments) and Unknown 09/24/2006  . Ciprofloxacin Rash 04/06/2014  . Gabapentin Other (See Comments) 12/19/2016  . Vicodin [hydrocodone -acetaminophen ] Rash 04/06/2014  . Oxycodone-acetaminophen  Rash 04/06/2014    Patient Active Problem List  Diagnosis  .  OCD (obsessive compulsive disorder)  . PTSD (post-traumatic stress disorder)  . Anxiety attack  . OSA on CPAP  . Other male erectile dysfunction  . Class 2 obesity due to excess calories without serious comorbidity with body mass index (BMI) of 35.0 to 35.9 in adult  . Diverticulosis (seen on CT 10/2023)    Past Medical History:  Diagnosis Date  . Anxiety   . Depression   . History of rhabdomyolysis    renal failure requiring dialysis, 2011  . History of ulcerative colitis   . Hypertension 03/14/2010   6months  . OCD (obsessive compulsive disorder)   . OSA (obstructive sleep apnea) 10/22/2020  . PTSD (post-traumatic stress disorder)   . Pure hypercholesterolemia (LDL 166 - 10/18/20) - diet controlled 10/22/2020  . Ulcerative colitis, chronic (CMS/HHS-HCC)     Past Surgical History:  Procedure Laterality Date  . COLONOSCOPY  2009   [facility])  . COLONOSCOPY  02/29/2016   Int Hemorrhoids, Diverticulosis: Repeat @ age 33 (02/2035)  . Endoscopic right carpal tunnel release. Right 04/28/2020   Dr.Poggi  .  Endoscopic left carpal tunnel release Left 01/12/2021   Dr.Poggi    Family History  Problem Relation Name Age of Onset  . Anxiety Mother William Curtis   . Stroke Father William Curtis   . Anxiety Brother William Curtis   . Depression Paternal Grandfather William Curtis   . Glaucoma Paternal William Curtis   . Colon polyps Neg Hx    . Colon cancer Neg Hx      Social History   Socioeconomic History  . Marital status:  Divorced  Tobacco Use  . Smoking status: Never    Passive exposure: Past  . Smokeless tobacco: Never  Vaping Use  . Vaping status: Never Used  Substance and Sexual Activity  . Alcohol use: Not Currently    Alcohol/week: 0.0 standard drinks of alcohol    Comment: Occasional  . Drug use: Never  . Sexual activity: Yes    Partners: Female    Birth control/protection: I.U.D., None  Social History Narrative   Marital status- Married   Lives with wife, William Curtis, and daughter    Employment- [employer]   Supplements- None   Exercise hx- Occasional   Religious affliation- Control and instrumentation engineer   Social Drivers of Health   Financial Resource Strain: Low Risk  (11/16/2023)   Overall Financial Resource Strain (CARDIA)   . Difficulty of Paying Living Expenses: Not hard at all  Food Insecurity: No Food Insecurity (11/16/2023)   Hunger Vital Sign   . Worried About Programme researcher, broadcasting/film/video in the Last Year: Never true   . Ran Out of Food in the Last Year: Never true  Transportation Needs: No Transportation Needs (11/16/2023)   PRAPARE - Transportation   . Lack of Transportation (Medical): No   . Lack of Transportation (Non-Medical): No  Housing Stability: Low Risk  (11/16/2023)   Housing Stability Vital Sign   . Unable to Pay for Housing in the Last Year: No   . Number of Times Moved in the Last Year: 0   . Homeless in the Last Year: No    Health Maintenance  Topic Date Due  . COVID-19 Vaccine (1 - 2024-25 season) Never done  . Depression Screening  11/16/2023  . Annual Visit/Physical/Well Child Check  11/16/2023  . Lipid Panel  11/14/2024  . Adult Tetanus (Td And Tdap)  12/30/2029  . Colonoscopy  03/01/2035  . Hepatitis C Screen  Completed  . Orthopoxvirus (Mpox virus) Vaccine  Completed  . Hib Vaccines  Aged Out  . Hepatitis A Vaccines  Aged Out  . Meningococcal B Vaccine  Aged Out  . Meningococcal ACWY Vaccine  Aged Out  . Pneumococcal Vaccine  Aged Out  . HPV Vaccines  Aged Out  . Varicella Vaccines  Discontinued  . HIV Screen  Discontinued  . Influenza Vaccine  Discontinued    Vitals:   11/16/23 1205  BP: 108/84  Pulse: 89  SpO2: 97%  Weight: (!) 112.9 kg (249 lb)  Height: 177.8 cm (5' 10)  PainSc:   3  PainLoc: Abdomen   Body mass index is 35.73 kg/m.  Exam  General. Well appearing; NAD; VS reviewed     Eyes. Sclera and conjunctiva clear; Vision grossly intact; extraocular movements intact Neck. Supple. No swelling, masses,  thyroid  normal size, no masses palpated.   Lungs. Respirations unlabored; clear to auscultation bilaterally Cardiovascular. Heart regular rate and rhythm without murmurs, gallops, or rubs Abdomen. Soft; non tender; non distended; normoactive bowel sounds; no masses or organomegaly Lymph Nodes. No significant cervical or supraclavicular lymphadenopathy noted Musculoskeletal. No deformities; no active joint inflammation Extremities. no edema Skin. Normal color and turgor Pulses. Dorsalis pedis palpable and symmetric bilaterally Neurologic. Alert and oriented x3; CN 2-12 grossly intact; no focal deficits  PHQ 2/9 last 3 flowsheet values     10/22/2020    2:30 PM 11/15/2022    2:03 PM  PHQ-2/9 Depression Screening   (OBSOLETE) Little interest or pleasure in doing things 0 0  (OBSOLETE) Feeling down, depressed, or hopeless (or  irritable for Teens only)? 0 0  (OBSOLETE) Total Prescreening Score 0 0  (OBSOLETE) Total Score = 0 0     Depression Severity and Treatment Recommendations:  0-4= None  5-9= Mild / Treatment: Support, educate to call if worse; return in one month  10-14= Moderate / Treatment: Support, watchful waiting; Antidepressant or Psychotherapy  15-19= Moderately severe / Treatment: Antidepressant OR Psychotherapy  >= 20 = Major depression, severe / Antidepressant AND Psychotherapy  [facility].  Assessment and Plan  1. Preventative health exam- Stable exam except for elevated BMI.  CV screening labs reviewed with patient.  No HTN, HLD, or DM.  CBC and TSH wnls.  No indication for early prostate or colon cancer screening. Vaccinations reviewed.  Counseled on nutrition modification and exercise.   Goals Addressed               This Visit's Progress   . * Create a wellness plan (pt-stated)   On track   . * Increase exercise (pt-stated)   Not on track      Follow up: 1 yr for PE; labs prior  ALDA CARPEN, MD *Some images  could not be shown.

## 2024-05-19 ENCOUNTER — Emergency Department

## 2024-05-19 ENCOUNTER — Other Ambulatory Visit: Payer: Self-pay

## 2024-05-19 ENCOUNTER — Emergency Department
Admission: EM | Admit: 2024-05-19 | Discharge: 2024-05-19 | Disposition: A | Discharge status: Home / Self Care | Attending: Emergency Medicine | Admitting: Emergency Medicine

## 2024-05-19 DIAGNOSIS — R0602 Shortness of breath: Secondary | ICD-10-CM | POA: Insufficient documentation

## 2024-05-19 DIAGNOSIS — R42 Dizziness and giddiness: Secondary | ICD-10-CM | POA: Diagnosis not present

## 2024-05-19 DIAGNOSIS — R002 Palpitations: Secondary | ICD-10-CM | POA: Diagnosis not present

## 2024-05-19 DIAGNOSIS — T43215A Adverse effect of selective serotonin and norepinephrine reuptake inhibitors, initial encounter: Secondary | ICD-10-CM | POA: Insufficient documentation

## 2024-05-19 DIAGNOSIS — T50905A Adverse effect of unspecified drugs, medicaments and biological substances, initial encounter: Secondary | ICD-10-CM

## 2024-05-19 LAB — BASIC METABOLIC PANEL WITH GFR
Anion gap: 8 (ref 5–15)
BUN: 19 mg/dL (ref 6–20)
CO2: 26 mmol/L (ref 22–32)
Calcium: 9.9 mg/dL (ref 8.9–10.3)
Chloride: 104 mmol/L (ref 98–111)
Creatinine, Ser: 0.91 mg/dL (ref 0.61–1.24)
GFR, Estimated: >60 mL/min (ref >60)
Glucose, Bld: 127 mg/dL — ABNORMAL HIGH (ref 70–99)
Potassium: 3.8 mmol/L (ref 3.5–5.1)
Sodium: 138 mmol/L (ref 135–145)

## 2024-05-19 LAB — CBC
HCT: 42.3 % (ref 39.0–52.0)
Hemoglobin: 14.9 g/dL (ref 13.0–17.0)
MCH: 31.6 pg (ref 26.0–34.0)
MCHC: 35.2 g/dL (ref 30.0–36.0)
MCV: 89.8 fL (ref 80.0–100.0)
Platelets: 259 K/uL (ref 150–400)
RBC: 4.71 MIL/uL (ref 4.22–5.81)
RDW: 12.6 % (ref 11.5–15.5)
WBC: 7.2 K/uL (ref 4.0–10.5)
nRBC: 0 % (ref 0.0–0.2)

## 2024-05-19 LAB — TROPONIN I (HIGH SENSITIVITY): Troponin I (High Sensitivity): 4 ng/L (ref <18)

## 2024-05-19 LAB — TSH: TSH: 3.045 u[IU]/mL (ref 0.350–4.500)

## 2024-05-19 LAB — T4, FREE: Free T4: 0.79 ng/dL (ref 0.61–1.12)

## 2024-05-19 LAB — MAGNESIUM: Magnesium: 2.5 mg/dL — ABNORMAL HIGH (ref 1.7–2.4)

## 2024-05-19 NOTE — Discharge Instructions (Addendum)
 I do think that the symptoms you are experiencing are most likely due to the prazosin medication that you started in the last 2 weeks.  Please talk to your doctor about alternative medications.  As we discussed that is probably okay to taper the medication back down to 1 pill per night for the next 1-2 nights, prior to stopping altogether.  Please call your doctor for further instructions on how to discontinue this medication and take their advice if they have any alternative suggestions.  Thank you for choosing us  for your health care today!  Please see your primary doctor this week for a follow up appointment.   If you have any new, worsening, or unexpected symptoms call your doctor right away or come back to the emergency department for reevaluation.  It was my pleasure to care for you today.   Ginnie EDISON Cyrena, MD

## 2024-05-19 NOTE — ED Provider Notes (Signed)
 [facility] Provider Note    Event Date/Time   First MD Initiated Contact with Patient 05/19/24 0019     (approximate)   History   Shortness of Breath   HPI  William Curtis is a 38 y.o. male   Past medical history of PTSD, anxiety, history of TBI, presents to the emergency department with lightheadedness, palpitations, shortness of breath.  This has been going on for the last 2 weeks and is associated with his start of his prazosin for night terrors that he started 2 weeks ago.  Symptoms appear to be correlated with taking his medications as they are worse in the hours after he takes them, and then start to decrease throughout the day and ramp up again after his next daily dose.  They also seem to have worsened after he increased his dose from 1 pill daily to 2 pills daily.  He notes that upon standing he feels lightheaded and he feels that his heart was racing, pounding.  He denies any chest pain.  During these episodes he feels short winded as well.  He denies respiratory infectious symptoms.  He has never had a blood clot, has no leg swelling or pain.  In terms of other medication changes, he has venlafaxine that was started 6 months ago and his last dose increase was 2 months ago.  He expresses no suicidal ideation, homicidal ideation, or audiovisual hallucinations.  He expresses no other acute medical complaints.  Independent Historian contributed to assessment above: His wife corroborates information given above  External Medical Documents Reviewed: Prior outpatient notes including a primary care visit in February 2025      Physical Exam   Triage Vital Signs: ED Triage Vitals  Encounter Vitals Group     BP 05/19/24 0008 (!) 139/97     Girls Systolic BP Percentile --      Girls Diastolic BP Percentile --      Boys Systolic BP Percentile --      Boys Diastolic BP Percentile --      Pulse Rate 05/19/24 0008 90     Resp 05/19/24 0008 18      Temp 05/19/24 0008 97.8 F (36.6 C)     Temp Source 05/19/24 0008 Oral     SpO2 05/19/24 0008 96 %     Weight 05/19/24 0009 280 lb (127 kg)     Height 05/19/24 0009 5' 10 (1.778 m)     Head Circumference --      Peak Flow --      Pain Score 05/19/24 0009 5     Pain Loc --      Pain Education --      Exclude from Growth Chart --     Most recent vital signs: Vitals:   05/19/24 0008 05/19/24 0030  BP: (!) 139/97   Pulse: 90   Resp: 18   Temp: 97.8 F (36.6 C)   SpO2: 96% 98%    General: Awake, no distress.  CV:  Good peripheral perfusion.  Resp:  Normal effort.  Abd:  No distention.  Other:  Blood pressure 139/97 at triage, 100/80 while resting in the room on my recheck.  Otherwise vital signs normal.  Awake alert appropriate cooperative pleasant young man.  No acute distress.  Answering questions appropriately.  Lung sounds clear to auscultation bilaterally.  Skin appears warm well-perfused.  No rigidity.  No clonus at the ankle.  Soft benign abdominal exam.  Heart rate regular  rate and rhythm without murmur.  No focal neurologic deficits on exam including facial asymmetry dysarthria motor or sensory deficits or finger-to-nose coordination.   ED Results / Procedures / Treatments   Labs (all labs ordered are listed, but only abnormal results are displayed) Labs Reviewed  BASIC METABOLIC PANEL WITH GFR - Abnormal; Notable for the following components:      Result Value   Glucose, Bld 127 (*)    All other components within normal limits  MAGNESIUM - Abnormal; Notable for the following components:   Magnesium 2.5 (*)    All other components within normal limits  CBC  TSH  T4, FREE  TROPONIN I (HIGH SENSITIVITY)     I ordered and reviewed the above labs they are notable for cell counts within normal limit, electrolytes unremarkable, negative initial troponin.  Thyroid  studies within normal limit.  EKG  ED ECG REPORT I, Ginnie Shams, the attending physician,  personally viewed and interpreted this ECG.   Date: 05/19/2024  EKG Time: 0010  Rate: 79  Rhythm: sinus  Axis: nl  Intervals:nl  ST&T Change: no stemi    RADIOLOGY I independently reviewed and interpreted chest x-ray I see no obvious focality pneumothorax I also reviewed radiologist's formal read.   PROCEDURES:  Critical Care performed: No  Procedures   MEDICATIONS ORDERED IN ED: Medications - No data to display   IMPRESSION / MDM / ASSESSMENT AND PLAN / ED COURSE  I reviewed the triage vital signs and the nursing notes.                                Patient's presentation is most consistent with acute presentation with potential threat to life or bodily function.  Differential diagnosis includes, but is not limited to, adverse effect of medication, orthostatic hypotension, dysrhythmia, electrolyte derangement, thyroid  abnormality, respiratory infection, considered but less likely ACS or PE   The patient is on the cardiac monitor to evaluate for evidence of arrhythmia and/or significant heart rate changes.  MDM:    I think that his symptoms are likely the effect of his prazosin and low blood pressure.  Orthostatic nature of symptoms associate with shortness of breath, palpitations, lightheadedness correlate with the timing of his medications, and or dose-dependent symptoms as he has increased over the last 1 week as well.  I did consider ACS, PE, electrolyte derangements, malignant dysrhythmias, stroke, but I think less likely especially in light of normal-appearing EKG, normal exam, and normal lab testing.  In terms of his depression/anxiety/PTSD he does not warrant inpatient psychiatric treatment at this time as he exhibits no SI HI or AVH.  He has very close follow-up in a good team that he is very happy with for psychiatric treatment as an outpatient.  He exhibits no signs of NMS or serotonin syndrome.  I will have him taper off of his prazosin over the next few  days and in the interim talk with his psychiatrist and primary doctor about alternative medications.  I considered hospitalization for admission or observation but I believe that outpatient management as above is more than at this time.        FINAL CLINICAL IMPRESSION(S) / ED DIAGNOSES   Final diagnoses:  Shortness of breath  Orthostatic lightheadedness  Palpitations  Adverse effect of drug, initial encounter     Rx / DC Orders   ED Discharge Orders     None  Note:  This document was prepared using Dragon voice recognition software and may include unintentional dictation errors.    Cyrena Mylar, MD 05/19/24 [phone]

## 2024-05-19 NOTE — ED Triage Notes (Signed)
 Patient ambulatory to triage with complaints of palpitations and shortness of breath. Patient states approx 2 weeks ago he had medication changed with dosing changes in venlafaxine and adding prazosin. States he feels palpitations are getting worse and waking him up

## 2024-09-20 ENCOUNTER — Emergency Department

## 2024-09-20 ENCOUNTER — Emergency Department: Admission: EM | Admit: 2024-09-20 | Discharge: 2024-09-20 | Disposition: A | Discharge status: Home / Self Care

## 2024-09-20 DIAGNOSIS — S01312A Laceration without foreign body of left ear, initial encounter: Secondary | ICD-10-CM | POA: Diagnosis not present

## 2024-09-20 DIAGNOSIS — S0990XA Unspecified injury of head, initial encounter: Secondary | ICD-10-CM | POA: Insufficient documentation

## 2024-09-20 DIAGNOSIS — S0993XA Unspecified injury of face, initial encounter: Secondary | ICD-10-CM | POA: Diagnosis present

## 2024-09-20 NOTE — ED Notes (Signed)
 Bleeding controlled, PA currently assessing laceration to left ear. Current GCS of 15.
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
## 2024-09-20 NOTE — Discharge Instructions (Signed)
 The laceration on your left ear was closed using skin glue.  The glue will begin to fall off in about a week.  Try not to pick at it. Watch for signs of infection including redness, warmth, swelling, pain and pus drainage.  If you develop any of these please return to the ED, urgent care or your primary care provider.  The imaging of your head and face was normal.  No fractures or bleeds.  Based on your symptoms today you may have a concussion. This is a clinical diagnosis which means there is no diagnostic test to confirm this. Symptoms of a concussion include: headache, dizziness, insomnia, fatigue, uneven gait, nausea, vomiting, blurred vision and difficulty concentrating. These symptoms may develop and change overtime. Most people have symptom resolution in 7-10 days but it may take up to two weeks to a month. If you are still having symptoms after 2 weeks please be evaluated by another healthcare provider. This could be your primary care provider, urgent care or the emergency department. You should take it easy for the 24-48 hours.  Avoid activities that require a lot of thinking or physical effort.  After a few days gradually return to normal activities as long as they do not make symptoms worse.  If your symptoms return or get worse, slow down and rest more.  Avoid activities that could lead to another head injury until cleared by a healthcare provider.  Do not drive, ride a bike or operate heavy machinery until you feel fully alert. Return to the ED if you have repeat vomiting, severe or worsening headache that does not go away with pain medication, trouble waking up, staying awake or unusual drowsiness, slurred speech or trouble speaking, weakness/numbness or trouble moving arms or legs, seizure, unequal pupil sizes or clear fluid or blood coming from the nose or ears.
